# Patient Record
Sex: Female | Born: 1995
Health system: Southern US, Community
[De-identification: ages and names within clinical notes are randomized; demographics above are authoritative.]

## PROBLEM LIST (undated history)

## (undated) DIAGNOSIS — D219 Benign neoplasm of connective and other soft tissue, unspecified: Secondary | ICD-10-CM

## (undated) HISTORY — PX: WISDOM TOOTH EXTRACTION: SHX21

## (undated) HISTORY — DX: Benign neoplasm of connective and other soft tissue, unspecified: D21.9

---
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
## 2014-09-18 ENCOUNTER — Ambulatory Visit (INDEPENDENT_AMBULATORY_CARE_PROVIDER_SITE_OTHER): Payer: Medicaid Other | Admitting: Family Medicine

## 2014-09-18 ENCOUNTER — Encounter: Payer: Self-pay | Admitting: Family Medicine

## 2014-09-18 ENCOUNTER — Other Ambulatory Visit (HOSPITAL_COMMUNITY)
Admission: RE | Admit: 2014-09-18 | Discharge: 2014-09-18 | Disposition: A | Discharge status: Home / Self Care | Payer: Medicaid Other | Source: Ambulatory Visit | Attending: Family Medicine | Admitting: Family Medicine

## 2014-09-18 VITALS — BP 109/70 | HR 103 | Temp 98.5°F | Ht 59.5 in | Wt 103.3 lb

## 2014-09-18 DIAGNOSIS — Z113 Encounter for screening for infections with a predominantly sexual mode of transmission: Secondary | ICD-10-CM | POA: Diagnosis not present

## 2014-09-18 DIAGNOSIS — N760 Acute vaginitis: Secondary | ICD-10-CM

## 2014-09-18 DIAGNOSIS — R350 Frequency of micturition: Secondary | ICD-10-CM

## 2014-09-18 DIAGNOSIS — R300 Dysuria: Secondary | ICD-10-CM

## 2014-09-18 LAB — POCT WET PREP (WET MOUNT): CLUE CELLS WET PREP WHIFF POC: POSITIVE

## 2014-09-18 LAB — POCT URINALYSIS DIPSTICK
Bilirubin, UA: NEGATIVE
Blood, UA: NEGATIVE
Glucose, UA: NEGATIVE
Ketones, UA: NEGATIVE
Nitrite, UA: NEGATIVE
PH UA: 6.5
Protein, UA: NEGATIVE
Spec Grav, UA: 1.025
Urobilinogen, UA: 0.2

## 2014-09-18 LAB — POCT UA - MICROSCOPIC ONLY

## 2014-09-18 MED ORDER — METRONIDAZOLE 500 MG PO TABS: 500.0000 mg | ORAL_TABLET | Freq: Two times a day (BID) | ORAL | Status: DC

## 2014-09-18 NOTE — Addendum Note (Signed)
Addended by: Briscoe DeutscherHESS, Miaisabella Bacorn R on: 09/18/2014 04:14 PM   Modules accepted: Orders

## 2014-09-18 NOTE — Progress Notes (Addendum)
  Current Issues:  Presents with 3 days of dysuria, urinary urgency and urinary frequency Associated symptoms include:  None.  Denies fever, chills, sweats, CVA TTP  There is no history of of similar symptoms. Sexually active:  Yes with female.   No concern for STI but wants to be tested   Prior to Admission medications   Medication Sig Start Date End Date Taking? Authorizing Provider  cetirizine (ZYRTEC) 10 MG tablet Take 1 tablet (10 mg total) by mouth daily. for allergies 12/03/12   [facility]armys [place] Criselda PeachesLa Paz, MD  drospirenone-ethinyl estradiol (LORYNA) 3-0.02 MG per tablet Take 1 tablet by mouth daily.      Historical Provider, MD  mupirocin cream (BACTROBAN) 2 % Apply topically 3 (three) times daily. 06/02/12   [facility] [place] Cris[place]da PeachesLa Paz, MD    Review of Systems:Negative except mentioned in HPI  PE:  There were no vitals taken for this visit. Constitutional: NAD Heart: RRR Lungs: CTAB Back: no CVA TTP Abdomen: Soft/NT/ND, NABS  Results for orders placed or performed in visit on 09/08/12  POCT urine pregnancy  Result Value Ref Range   Preg Test, Ur Negative     Assessment and Plan:  1) Dysuria 2) Screening for STI 3) Vaginal D/C - Obtain POC U/A with microscopy.  Tx if indicated pending results  - Screen for STI including HIV/RPR/GC/Chlamydia  - Wet Prep and Tx accordingly   Addendum:  + Wet Prep for BV, Tx with Flagyl 500 mg BID x 7 days, discussed with pt, no questions

## 2014-09-18 NOTE — Patient Instructions (Signed)

## 2014-09-18 NOTE — Addendum Note (Signed)
Addended by: SwazilandJORDAN, Manolito Jurewicz on: 09/18/2014 04:09 PM   Modules accepted: Orders

## 2014-09-19 ENCOUNTER — Telehealth: Payer: Self-pay | Admitting: Family Medicine

## 2014-09-19 LAB — HIV ANTIBODY (ROUTINE TESTING W REFLEX): HIV 1&2 Ab, 4th Generation: NONREACTIVE

## 2014-09-19 LAB — RPR

## 2014-09-19 NOTE — Telephone Encounter (Signed)
Discussed results with pt regarding HIV/RPR, no questions further.  Twana FirstBryan R. Paulina FusiHess, DO of [facility] 09/19/2014, 12:24 PM

## 2014-09-20 LAB — URINE CYTOLOGY ANCILLARY ONLY
CHLAMYDIA, DNA PROBE: NEGATIVE
NEISSERIA GONORRHEA: NEGATIVE

## 2014-11-07 ENCOUNTER — Telehealth: Payer: Self-pay | Admitting: Family Medicine

## 2014-11-07 NOTE — Telephone Encounter (Signed)
Would like to have her prescription refill for yeast infection

## 2014-11-10 NOTE — Telephone Encounter (Signed)
Don't see anything on her sheet for yeast infection.  If she is still having d/c, she may need to be seen to confirm.    Thanks Tesoro CorporationBryan R. Paulina FusiHess, DO of [facility] 11/10/2014, 4:26 PM

## 2014-11-21 ENCOUNTER — Ambulatory Visit (INDEPENDENT_AMBULATORY_CARE_PROVIDER_SITE_OTHER): Payer: Medicaid Other | Admitting: Family Medicine

## 2014-11-21 ENCOUNTER — Telehealth: Payer: Self-pay | Admitting: Family Medicine

## 2014-11-21 ENCOUNTER — Encounter: Payer: Self-pay | Admitting: Family Medicine

## 2014-11-21 VITALS — BP 104/73 | HR 74 | Temp 98.4°F | Ht 59.5 in | Wt 100.6 lb

## 2014-11-21 DIAGNOSIS — N898 Other specified noninflammatory disorders of vagina: Secondary | ICD-10-CM

## 2014-11-21 LAB — POCT WET PREP (WET MOUNT)
Clue Cells Wet Prep Whiff POC: NEGATIVE
WBC, Wet Prep HPF POC: >20

## 2014-11-21 MED ORDER — FLUCONAZOLE 150 MG PO TABS: 150.0000 mg | ORAL_TABLET | Freq: Once | ORAL | Status: DC

## 2014-11-21 NOTE — Assessment & Plan Note (Addendum)
Wet prep collected today. AVS on safe sex and BV Patient will be called with results, and treated appropriately.

## 2014-11-21 NOTE — Progress Notes (Signed)
   Subjective:    Patient ID: Katherine Neal, female    DOB: [birth year], 19 y.o.   MRN: [id number]  HPI  Vaginal discharge: Patient presents to family medicine clinic with complaints of vaginal irritation. She states she is having a yellowish discharge. She had a bacterial infection in January, was given Flagyl with improvement in symptoms. She feels the symptoms are identical this time, minus the odor. She has the same sexual partner, female, condom use. Patient's menses are irregular, Mirena use.  Never smoker  No past medical history on file. No Known Allergies   Review of Systems Per hPI    Objective:   Physical Exam BP 104/73 mmHg  Pulse 74  Temp(Src) 98.4 F (36.9 C) (Oral)  Ht 4' 11.5" (1.511 m)  Wt 100 lb 9.6 oz (45.632 kg)  BMI 19.99 kg/m2 Gen: NAD. Nontoxic in appearance, pleasant, African-American female. Thin. Abd: Soft. Flat. NTND. BS present. No Masses palpated.  GYN:  External genitalia within normal limits.  Vaginal mucosa pink, moist, normal rugae.  Nonfriable cervix without lesions, moderate white/yellowish chunky discharge. No bleeding noted on speculum exam.  No cervical motion tenderness. No adnexal masses bilaterally.  IUD strings identified in cervical os        Assessment & Plan:

## 2014-11-21 NOTE — Progress Notes (Signed)
I was preceptor the day of this visit.   

## 2014-11-21 NOTE — Telephone Encounter (Signed)
Please call pt, she has a yeast infection. I have called in diflucan for her take. Thanks.

## 2014-11-21 NOTE — Patient Instructions (Signed)
I will call you with results once they become available and: Medication as needed.  Bacterial Vaginosis Bacterial vaginosis is a vaginal infection that occurs when the normal balance of bacteria in the vagina is disrupted. It results from an overgrowth of certain bacteria. This is the most common vaginal infection in women of childbearing age. Treatment is important to prevent complications, especially in pregnant women, as it can cause a premature delivery. CAUSES  Bacterial vaginosis is caused by an increase in harmful bacteria that are normally present in smaller amounts in the vagina. Several different kinds of bacteria can cause bacterial vaginosis. However, the reason that the condition develops is not fully understood. RISK FACTORS Certain activities or behaviors can put you at an increased risk of developing bacterial vaginosis, including:  Having a new sex partner or multiple sex partners.  Douching.  Using an intrauterine device (IUD) for contraception. Women do not get bacterial vaginosis from toilet seats, bedding, swimming pools, or contact with objects around them. SIGNS AND SYMPTOMS  Some women with bacterial vaginosis have no signs or symptoms. Common symptoms include:  Grey vaginal discharge.  A fishlike odor with discharge, especially after sexual intercourse.  Itching or burning of the vagina and vulva.  Burning or pain with urination. DIAGNOSIS  Your health care provider will take a medical history and examine the vagina for signs of bacterial vaginosis. A sample of vaginal fluid may be taken. Your health care provider will look at this sample under a microscope to check for bacteria and abnormal cells. A vaginal pH test may also be done.  TREATMENT  Bacterial vaginosis may be treated with antibiotic medicines. These may be given in the form of a pill or a vaginal cream. A second round of antibiotics may be prescribed if the condition comes back after treatment.  HOME  CARE INSTRUCTIONS   Only take over-the-counter or prescription medicines as directed by your health care provider.  If antibiotic medicine was prescribed, take it as directed. Make sure you finish it even if you start to feel better.  Do not have sex until treatment is completed.  Tell all sexual partners that you have a vaginal infection. They should see their health care provider and be treated if they have problems, such as a mild rash or itching.  Practice safe sex by using condoms and only having one sex partner. SEEK MEDICAL CARE IF:   Your symptoms are not improving after 3 days of treatment.  You have increased discharge or pain.  You have a fever. MAKE SURE YOU:   Understand these instructions.  Will watch your condition.  Will get help right away if you are not doing well or get worse. FOR MORE INFORMATION  Centers for Disease Control and Prevention, Division of STD Prevention: SolutionApps.co.zawww.cdc.gov/std American Sexual Health Association (ASHA): www.ashastd.org  Document Released: 08/18/2005 Document Revised: 06/08/2013 Document Reviewed: 03/30/2013 [facility] Patient Information 2015 [place]. This information is not intended to replace advice given to you by your health care provider. Make sure you discuss any questions you have with your health care provider. Safe Sex Safe sex is about reducing the risk of giving or getting a sexually transmitted disease (STD). STDs are spread through sexual contact involving the genitals, mouth, or rectum. Some STDs can be cured and others cannot. Safe sex can also prevent unintended pregnancies.  WHAT ARE SOME SAFE SEX PRACTICES?  Limit your sexual activity to only one partner who is having sex with only you.  Talk  to your partner about his or her past partners, past STDs, and drug use.  Use a condom every time you have sexual intercourse. This includes vaginal, oral, and anal sexual activity. Both females and males should wear condoms  during oral sex. Only use latex or polyurethane condoms and water-based lubricants. Using petroleum-based lubricants or oils to lubricate a condom will weaken the condom and increase the chance that it will break. The condom should be in place from the beginning to the end of sexual activity. Wearing a condom reduces, but does not completely eliminate, your risk of getting or giving an STD. STDs can be spread by contact with infected body fluids and skin.  Get vaccinated for hepatitis B and HPV.  Avoid alcohol and recreational drugs, which can affect your judgment. You may forget to use a condom or participate in high-risk sex.  For females, avoid douching after sexual intercourse. Douching can spread an infection farther into the reproductive tract.  Check your body for signs of sores, blisters, rashes, or unusual discharge. See your health care provider if you notice any of these signs.  Avoid sexual contact if you have symptoms of an infection or are being treated for an STD. If you or your partner has herpes, avoid sexual contact when blisters are present. Use condoms at all other times.  If you are at risk of being infected with HIV, it is recommended that you take a prescription medicine daily to prevent HIV infection. This is called pre-exposure prophylaxis (PrEP). You are considered at risk if:  You are a man who has sex with other men (MSM).  You are a heterosexual man or woman who is sexually active with more than one partner.  You take drugs by injection.  You are sexually active with a partner who has HIV.  Talk with your health care provider about whether you are at high risk of being infected with HIV. If you choose to begin PrEP, you should first be tested for HIV. You should then be tested every 3 months for as long as you are taking PrEP.  See your health care provider for regular screenings, exams, and tests for other STDs. Before having sex with a new partner, each of you  should be screened for STDs and should talk about the results with each other. WHAT ARE THE BENEFITS OF SAFE SEX?   There is less chance of getting or giving an STD.  You can prevent unwanted or unintended pregnancies.  By discussing safe sex concerns with your partner, you may increase feelings of intimacy, comfort, trust, and honesty between the two of you. Document Released: 09/25/2004 Document Revised: 01/02/2014 Document Reviewed: 02/09/2012 [facility] Patient Information 2015 [place], [place]. This information is not intended to replace advice given to you by your health care provider. Make sure you discuss any questions you have with your health care provider.

## 2014-11-22 ENCOUNTER — Telehealth: Payer: Self-pay | Admitting: Family Medicine

## 2014-11-22 NOTE — Telephone Encounter (Signed)
LVM on mobile and also tried home 1st (said not available try again later) to inform her of message below. [name], Eadie Repetto D

## 2014-11-22 NOTE — Telephone Encounter (Signed)
Katherine FootmanSara Neal spoke to pt regarding yeast infection / Katherine BasemanSadie Neal, ASA

## 2014-11-22 NOTE — Telephone Encounter (Signed)
Katherine FootmanSara Neal spoke to [name], ASA

## 2014-12-19 ENCOUNTER — Ambulatory Visit (INDEPENDENT_AMBULATORY_CARE_PROVIDER_SITE_OTHER): Payer: Medicaid Other | Admitting: Family Medicine

## 2014-12-19 ENCOUNTER — Other Ambulatory Visit (HOSPITAL_COMMUNITY)
Admission: RE | Admit: 2014-12-19 | Discharge: 2014-12-19 | Disposition: A | Discharge status: Home / Self Care | Payer: Medicaid Other | Source: Ambulatory Visit | Attending: Family Medicine | Admitting: Family Medicine

## 2014-12-19 ENCOUNTER — Encounter: Payer: Self-pay | Admitting: Family Medicine

## 2014-12-19 VITALS — BP 104/72 | HR 94 | Temp 98.5°F | Ht 59.5 in | Wt 102.1 lb

## 2014-12-19 DIAGNOSIS — N898 Other specified noninflammatory disorders of vagina: Secondary | ICD-10-CM | POA: Diagnosis not present

## 2014-12-19 DIAGNOSIS — N760 Acute vaginitis: Secondary | ICD-10-CM | POA: Insufficient documentation

## 2014-12-19 DIAGNOSIS — R300 Dysuria: Secondary | ICD-10-CM | POA: Diagnosis not present

## 2014-12-19 DIAGNOSIS — Z113 Encounter for screening for infections with a predominantly sexual mode of transmission: Secondary | ICD-10-CM | POA: Insufficient documentation

## 2014-12-19 DIAGNOSIS — N9489 Other specified conditions associated with female genital organs and menstrual cycle: Secondary | ICD-10-CM

## 2014-12-19 DIAGNOSIS — N949 Unspecified condition associated with female genital organs and menstrual cycle: Secondary | ICD-10-CM | POA: Diagnosis not present

## 2014-12-19 LAB — POCT WET PREP (WET MOUNT): CLUE CELLS WET PREP WHIFF POC: POSITIVE

## 2014-12-19 LAB — POCT URINALYSIS DIPSTICK
Bilirubin, UA: NEGATIVE
Blood, UA: NEGATIVE
Glucose, UA: NEGATIVE
Ketones, UA: NEGATIVE
Leukocytes, UA: NEGATIVE
Nitrite, UA: NEGATIVE
Protein, UA: NEGATIVE
Spec Grav, UA: 1.02
Urobilinogen, UA: 0.2
pH, UA: 6

## 2014-12-19 LAB — POCT URINE PREGNANCY: Preg Test, Ur: NEGATIVE

## 2014-12-19 MED ORDER — METRONIDAZOLE 500 MG PO TABS: 500.0000 mg | ORAL_TABLET | Freq: Two times a day (BID) | ORAL | Status: DC

## 2014-12-19 NOTE — Patient Instructions (Addendum)
We are checking several tests: -ultrasound -STD screening  It also looks like you have bacterial vaginosis. Take flagyl 500mg  twice a day for 7 days. Do not drink alcohol while on this medicine.  If your pain gets worse, you have a fever, you can't eat or drink, or have any other new symptoms, please return for a visit or go to the ER.   I will call you or send you a letter with your test results.  Be well, Dr. Pollie MeyerMcIntyre

## 2014-12-20 LAB — CERVICOVAGINAL ANCILLARY ONLY
CHLAMYDIA, DNA PROBE: NEGATIVE
NEISSERIA GONORRHEA: NEGATIVE
TRICH (WINDOWPATH): NEGATIVE

## 2014-12-20 NOTE — Progress Notes (Signed)
Patient ID: Katherine Neal, female   DOB: March 07, 1996, 19 y.o.   MRN: [id number]  HPI:  Pt presents for a same day appointment to discuss vaginal discharge.  Patient reports that Wednesday or Thursday of last week began having pain and swelling on the inside of her vulva. She has noted some brownish mild discharge. Has not had any fever. The pain has decreased to about a 4 out of 10. Eating and drinking well. Normally stooling. Has mild burning with urine. Has had a Mirena since February 2014. Does not get periods with her IUD. She is sexually active with one female partner in the last year. Denies any concerns for STDs.   ROS: See HPI  PMFSH: hx allergic rhinitis, IUD in place  PHYSICAL EXAM: BP 104/72 mmHg  Pulse 94  Temp(Src) 98.5 F (36.9 C) (Oral)  Ht 4' 11.5" (1.511 m)  Wt 102 lb 1.6 oz (46.312 kg)  BMI 20.28 kg/m2 Gen: No acute distress, pleasant, cooperative Abdomen: Soft, nontender to palpation GU: normal appearing external genitalia without lesions. Vagina is moist with clear discharge. Cervix normal in appearance. There is a prominent left adnexal mass on bimanual exam, which is also tender to palpation. No pain with cervical motion. No right adnexal mass.  Neuro: grossly nonfocal speech normal  ASSESSMENT/PLAN:  Adnexal mass Prominent left adnexal mass on pelvic exam. Differential diagnosis includes ovarian cyst, tubo-ovarian abscess, ectopic pregnancy, malignancy. Urine pregnancy test negative today. -Check GC/Chlamydia and wet prep -Pelvic ultrasound to further evaluate mass -Discussed reasons which should prompt repeat evaluation including increased pain, vomiting, fever   Vaginal discharge Wet prep suggestive of BV. Urinalysis negative for any signs of infection. Will treat with Flagyl for 7 days. Follow-up if not improving.     FOLLOW UP: F/u pending results of ultrasound & STD testing  GrenadaBrittany J. Pollie MeyerMcIntyre, MD [facility]

## 2014-12-22 ENCOUNTER — Encounter: Payer: Self-pay | Admitting: Family Medicine

## 2014-12-22 DIAGNOSIS — N9489 Other specified conditions associated with female genital organs and menstrual cycle: Secondary | ICD-10-CM | POA: Insufficient documentation

## 2014-12-22 NOTE — Assessment & Plan Note (Signed)
Wet prep suggestive of BV. Urinalysis negative for any signs of infection. Will treat with Flagyl for 7 days. Follow-up if not improving.

## 2014-12-22 NOTE — Assessment & Plan Note (Addendum)
Prominent left adnexal mass on pelvic exam. Differential diagnosis includes ovarian cyst, tubo-ovarian abscess, ectopic pregnancy, malignancy. Urine pregnancy test negative today. -Check GC/Chlamydia and wet prep -Pelvic ultrasound to further evaluate mass -Discussed reasons which should prompt repeat evaluation including increased pain, vomiting, fever

## 2014-12-25 ENCOUNTER — Ambulatory Visit (HOSPITAL_COMMUNITY): Payer: Medicaid Other

## 2014-12-25 NOTE — Progress Notes (Signed)
I was the preceptor for this visit. 

## 2014-12-27 ENCOUNTER — Ambulatory Visit (HOSPITAL_COMMUNITY)
Admission: RE | Admit: 2014-12-27 | Discharge: 2014-12-27 | Disposition: A | Discharge status: Home / Self Care | Payer: Medicaid Other | Source: Ambulatory Visit | Attending: Family Medicine | Admitting: Family Medicine

## 2014-12-27 ENCOUNTER — Encounter (HOSPITAL_COMMUNITY): Payer: Self-pay

## 2014-12-27 DIAGNOSIS — N9489 Other specified conditions associated with female genital organs and menstrual cycle: Secondary | ICD-10-CM | POA: Diagnosis present

## 2014-12-27 DIAGNOSIS — Z30431 Encounter for routine checking of intrauterine contraceptive device: Secondary | ICD-10-CM | POA: Diagnosis not present

## 2014-12-29 ENCOUNTER — Telehealth: Payer: Self-pay | Admitting: Family Medicine

## 2014-12-29 NOTE — Telephone Encounter (Signed)
Called patient regarding ultrasound results. Informed of normal ultrasound. Recommend she return for a visit for repeat pelvic exam. If left adnexal mass is still palpable, would proceed with CT abdomen and pelvis with contrast to ensure we are not missing any bowel pathology or other pelvic pathology. If she does need a CT scan, she will need lab work drawn to assess renal function prior to administering contrast.  Answered questions to patient's satisfaction. Will route to PCP as an FYI.  Latrelle DodrillBrittany J Kiira Brach, MD

## 2015-03-13 ENCOUNTER — Emergency Department (HOSPITAL_COMMUNITY)
Admission: EM | Admit: 2015-03-13 | Discharge: 2015-03-14 | Disposition: A | Discharge status: Home / Self Care | Payer: Medicaid Other | Attending: Emergency Medicine | Admitting: Emergency Medicine

## 2015-03-13 ENCOUNTER — Encounter (HOSPITAL_COMMUNITY): Payer: Self-pay | Admitting: Emergency Medicine

## 2015-03-13 DIAGNOSIS — Z79899 Other long term (current) drug therapy: Secondary | ICD-10-CM | POA: Diagnosis not present

## 2015-03-13 DIAGNOSIS — Z79818 Long term (current) use of other agents affecting estrogen receptors and estrogen levels: Secondary | ICD-10-CM | POA: Insufficient documentation

## 2015-03-13 DIAGNOSIS — N390 Urinary tract infection, site not specified: Secondary | ICD-10-CM

## 2015-03-13 DIAGNOSIS — Z3202 Encounter for pregnancy test, result negative: Secondary | ICD-10-CM | POA: Diagnosis not present

## 2015-03-13 DIAGNOSIS — Z792 Long term (current) use of antibiotics: Secondary | ICD-10-CM | POA: Insufficient documentation

## 2015-03-13 DIAGNOSIS — R102 Pelvic and perineal pain: Secondary | ICD-10-CM | POA: Diagnosis present

## 2015-03-13 NOTE — ED Notes (Signed)
Pt states that tonight she noticed blood in her urine and she has lower pelvic pain. Alert and oriented.

## 2015-03-14 LAB — URINALYSIS, ROUTINE W REFLEX MICROSCOPIC
Bilirubin Urine: NEGATIVE
GLUCOSE, UA: NEGATIVE mg/dL
Ketones, ur: NEGATIVE mg/dL
Nitrite: NEGATIVE
Protein, ur: 100 mg/dL — AB
Specific Gravity, Urine: 1.013 (ref 1.005–1.030)
Urobilinogen, UA: 0.2 mg/dL (ref 0.0–1.0)
pH: 6 (ref 5.0–8.0)

## 2015-03-14 LAB — PREGNANCY, URINE: Preg Test, Ur: NEGATIVE

## 2015-03-14 LAB — URINE MICROSCOPIC-ADD ON

## 2015-03-14 MED ORDER — CEPHALEXIN 500 MG PO CAPS: 500.0000 mg | ORAL_CAPSULE | Freq: Four times a day (QID) | ORAL | Status: DC

## 2015-03-14 MED ORDER — PHENAZOPYRIDINE HCL 200 MG PO TABS: 200.0000 mg | ORAL_TABLET | Freq: Three times a day (TID) | ORAL | Status: DC

## 2015-03-14 NOTE — ED Provider Notes (Signed)
CSN: [id number]     Arrival date & time 03/13/15  2106 History   First MD Initiated Contact with Patient 03/14/15 0048     Chief Complaint  Patient presents with  . Pelvic Pain  . Hematuria     (Consider location/radiation/quality/duration/timing/severity/associated sxs/prior Treatment) Patient is a 19 y.o. female presenting with pelvic pain and hematuria. The history is provided by the patient. No language interpreter was used.  Pelvic Pain This is a new problem. The current episode started today. Pertinent negatives include no chills, fever, nausea or vomiting. Associated symptoms comments: She started experiencing sharp suprapubic pain associated with urinary frequency, dysuria and a sense she still had to urinate when finished. No fever, nausea, vomiting, vaginal discharge or abnormal vaginal bleeding..  Hematuria Pertinent negatives include no chills, fever, nausea or vomiting.    History reviewed. No pertinent past medical history. Past Surgical History  Procedure Laterality Date  . Wisdom tooth extraction     History reviewed. No pertinent family history. History  Substance Use Topics  . Smoking status: Never Smoker   . Smokeless tobacco: Not on file  . Alcohol Use: No   OB History    Gravida Para Term Preterm AB TAB SAB Ectopic Multiple Living   0 0 0 0 0 0 0 0 0 0      Review of Systems  Constitutional: Negative for fever and chills.  Gastrointestinal: Negative.  Negative for nausea and vomiting.  Genitourinary: Positive for dysuria, frequency, hematuria and pelvic pain. Negative for vaginal bleeding and vaginal discharge.  Musculoskeletal: Negative.   Skin: Negative.   Neurological: Negative.       Allergies  Review of patient's allergies indicates no known allergies.  Home Medications   Prior to Admission medications   Medication Sig Start Date End Date Taking? Authorizing Provider  cetirizine (ZYRTEC) 10 MG tablet Take 1 tablet (10 mg total) by mouth  daily. for allergies 12/03/12   Dayarmys Piloto de Criselda PeachesLa Paz, MD  drospirenone-ethinyl estradiol (LORYNA) 3-0.02 MG per tablet Take 1 tablet by mouth daily.      Historical Provider, MD  fluconazole (DIFLUCAN) 150 MG tablet Take 1 tablet (150 mg total) by mouth once. 11/21/14   Renee A Kuneff, DO  metroNIDAZOLE (FLAGYL) 500 MG tablet Take 1 tablet (500 mg total) by mouth 2 (two) times daily. 12/19/14   Latrelle DodrillBrittany J McIntyre, MD  mupirocin cream (BACTROBAN) 2 % Apply topically 3 (three) times daily. 06/02/12   Dayarmys [place] Cris[place]da PeachesLa Paz, MD   BP 113/69 mmHg  Pulse 92  Temp(Src) 99 F (37.2 C) (Oral)  Resp 15  Ht 4\' 11"  (1.499 m)  Wt 104 lb (47.174 kg)  BMI 20.99 kg/m2  SpO2 100% Physical Exam  Constitutional: She is oriented to person, place, and time. She appears well-developed and well-nourished.  Neck: Normal range of motion.  Cardiovascular: Normal rate.   Pulmonary/Chest: Effort normal.  Abdominal: There is no tenderness. There is no rebound and no guarding.  Neurological: She is alert and oriented to person, place, and time.  Skin: Skin is warm and dry.    ED Course  Procedures (including critical care time) Labs Review Labs Reviewed  URINALYSIS, ROUTINE W REFLEX MICROSCOPIC (NOT AT [facility])  PREGNANCY, URINE    Imaging Review No results found.   EKG Interpretation None      MDM   Final diagnoses:  None    1. UTI  Uncomplicated hemorrhagic cystitis requiring antibiotic treatment. Stable for discharge.  Elpidio Anis, PA-C 03/14/15 0622  Layla Maw Ward, DO 03/14/15 9563

## 2015-03-14 NOTE — Discharge Instructions (Signed)

## 2015-08-02 ENCOUNTER — Ambulatory Visit (INDEPENDENT_AMBULATORY_CARE_PROVIDER_SITE_OTHER): Payer: Medicaid Other | Admitting: Family Medicine

## 2015-08-02 VITALS — BP 108/59 | HR 105 | Temp 98.4°F | Ht 59.0 in | Wt 99.6 lb

## 2015-08-02 DIAGNOSIS — Z30433 Encounter for removal and reinsertion of intrauterine contraceptive device: Secondary | ICD-10-CM | POA: Diagnosis present

## 2015-08-02 MED ORDER — LEVONORGESTREL 20 MCG/24HR IU IUD
INTRAUTERINE_SYSTEM | Freq: Once | INTRAUTERINE | Status: AC
  Administered 2015-08-02: 1 via INTRAUTERINE

## 2015-08-02 NOTE — Progress Notes (Signed)
Patient ID: Katherine Neal, female   DOB: [birth year], 19 y.o.   MRN: [id number] PRE-OP DIAGNOSIS: desired long-term, reversible contraception ( Skyla removal and Mirena insertion).  POST-OP DIAGNOSIS: Same   PROCEDURE: IUD removal and placement  Performing Physician: Dr. Lum BabeEniola and Dr. Jaquita RectorMelancon  Supervising Physician (if applicable): Dr. Lum BabeEniola    IUD type: [X_] Mirena   [_] Paraguard   Skyla removal was successfully carried out without complication by Dr. Jaquita RectorMelancon   PROCEDURE:  Timeout procedure was performed to ensure right patient and right site.  The speculum was placed. The vagina and cervix was sterilized in the usual manner and sterile technique was maintained throughout the course of the procedure. A single toothed tenaculum was applied to the anterior lip of the cervix and gentle traction applied to straighten and stabilize it. The depth of the uterus was sounded to be of appropriate depth  6.5 . With gentle traction on the tenaculum, the IUD was inserted to the appropriate depth and deposited by withdrawing on the insertion tube holding the rod steady.  The string was cut to an estimated 2-3 cm length. Bleeding was minimal. The patient tolerated the procedure well.    Followup: The patient tolerated the procedure well without complications.  Standard post-procedure care is explained and return precautions are given.

## 2015-08-02 NOTE — Patient Instructions (Signed)
Please come back in 1-2 wks for thread check.  Levonorgestrel intrauterine device (IUD) What is this medicine? LEVONORGESTREL IUD (LEE voe nor jes trel) is a contraceptive (birth control) device. The device is placed inside the uterus by a healthcare professional. It is used to prevent pregnancy and can also be used to treat heavy bleeding that occurs during your period. Depending on the device, it can be used for 3 to 5 years. This medicine may be used for other purposes; ask your health care provider or pharmacist if you have questions. What should I tell my health care provider before I take this medicine? They need to know if you have any of these conditions: -abnormal Pap smear -cancer of the breast, uterus, or cervix -diabetes -endometritis -genital or pelvic infection now or in the past -have more than one sexual partner or your partner has more than one partner -heart disease -history of an ectopic or tubal pregnancy -immune system problems -IUD in place -liver disease or tumor -problems with blood clots or take blood-thinners -use intravenous drugs -uterus of unusual shape -vaginal bleeding that has not been explained -an unusual or allergic reaction to levonorgestrel, other hormones, silicone, or polyethylene, medicines, foods, dyes, or preservatives -pregnant or trying to get pregnant -breast-feeding How should I use this medicine? This device is placed inside the uterus by a health care professional. Talk to your pediatrician regarding the use of this medicine in children. Special care may be needed. Overdosage: If you think you have taken too much of this medicine contact a poison control center or emergency room at once. NOTE: This medicine is only for you. Do not share this medicine with others. What if I miss a dose? This does not apply. What may interact with this medicine? Do not take this medicine with any of the following  medications: -amprenavir -bosentan -fosamprenavir This medicine may also interact with the following medications: -aprepitant -barbiturate medicines for inducing sleep or treating seizures -bexarotene -griseofulvin -medicines to treat seizures like carbamazepine, ethotoin, felbamate, oxcarbazepine, phenytoin, topiramate -modafinil -pioglitazone -rifabutin -rifampin -rifapentine -some medicines to treat HIV infection like atazanavir, indinavir, lopinavir, nelfinavir, tipranavir, ritonavir -St. John's wort -warfarin This list may not describe all possible interactions. Give your health care provider a list of all the medicines, herbs, non-prescription drugs, or dietary supplements you use. Also tell them if you smoke, drink alcohol, or use illegal drugs. Some items may interact with your medicine. What should I watch for while using this medicine? Visit your doctor or health care professional for regular check ups. See your doctor if you or your partner has sexual contact with others, becomes HIV positive, or gets a sexual transmitted disease. This product does not protect you against HIV infection (AIDS) or other sexually transmitted diseases. You can check the placement of the IUD yourself by reaching up to the top of your vagina with clean fingers to feel the threads. Do not pull on the threads. It is a good habit to check placement after each menstrual period. Call your doctor right away if you feel more of the IUD than just the threads or if you cannot feel the threads at all. The IUD may come out by itself. You may become pregnant if the device comes out. If you notice that the IUD has come out use a backup birth control method like condoms and call your health care provider. Using tampons will not change the position of the IUD and are okay to use during your period.  What side effects may I notice from receiving this medicine? Side effects that you should report to your doctor or  health care professional as soon as possible: -allergic reactions like skin rash, itching or hives, swelling of the face, lips, or tongue -fever, flu-like symptoms -genital sores -high blood pressure -no menstrual period for 6 weeks during use -pain, swelling, warmth in the leg -pelvic pain or tenderness -severe or sudden headache -signs of pregnancy -stomach cramping -sudden shortness of breath -trouble with balance, talking, or walking -unusual vaginal bleeding, discharge -yellowing of the eyes or skin Side effects that usually do not require medical attention (report to your doctor or health care professional if they continue or are bothersome): -acne -breast pain -change in sex drive or performance -changes in weight -cramping, dizziness, or faintness while the device is being inserted -headache -irregular menstrual bleeding within first 3 to 6 months of use -nausea This list may not describe all possible side effects. Call your doctor for medical advice about side effects. You may report side effects to FDA at 1-800-FDA-1088. Where should I keep my medicine? This does not apply. NOTE: This sheet is a summary. It may not cover all possible information. If you have questions about this medicine, talk to your doctor, pharmacist, or health care provider.    2016, Elsevier/Gold Standard. (2011-09-18 13:54:04)

## 2015-08-02 NOTE — Addendum Note (Signed)
Addended by: Jone BasemanFLEEGER, JESSICA D on: 08/02/2015 02:35 PM   Modules accepted: Orders

## 2015-08-28 ENCOUNTER — Ambulatory Visit (INDEPENDENT_AMBULATORY_CARE_PROVIDER_SITE_OTHER): Payer: Medicaid Other | Admitting: Family Medicine

## 2015-08-28 ENCOUNTER — Encounter: Payer: Self-pay | Admitting: Family Medicine

## 2015-08-28 VITALS — BP 89/58 | HR 92 | Temp 98.4°F | Wt 101.0 lb

## 2015-08-28 DIAGNOSIS — Z23 Encounter for immunization: Secondary | ICD-10-CM

## 2015-08-28 DIAGNOSIS — N898 Other specified noninflammatory disorders of vagina: Secondary | ICD-10-CM

## 2015-08-28 LAB — POCT WET PREP (WET MOUNT): Clue Cells Wet Prep Whiff POC: POSITIVE

## 2015-08-28 MED ORDER — METRONIDAZOLE 0.75 % VA GEL: 1.0000 | Freq: Every day | VAGINAL | Status: AC

## 2015-08-28 NOTE — Patient Instructions (Signed)
Thank you so much for coming in today! Your IUD strings were visualized on exam. I will contact you with the results of your wet prep and will prescribe a medication if necessary.   Thanks again! Dr. Caroleen Hammanumley

## 2015-08-28 NOTE — Assessment & Plan Note (Signed)
-   Wet prep today with moderate bacteria, moderate clue cells, and positive whiff test - Requests alternative to oral metronidazole. Vaginal metronidazole cream given to complete five day course. - Refuses STD Screening given recent screen in 12/2014 with no new sexual partners. Consider if fails to improve.

## 2015-08-28 NOTE — Progress Notes (Signed)
Subjective:     Patient ID: Katherine Neal, female   DOB: 1996-06-12, 19 y.o.   MRN: [id number]  HPI [name] is a 19yo female presenting today for IUD string check and vaginal discharge. - IUD placed 08/02/15. Denies complications. - Reports vaginal discharge for four days. Discharge yellow/white. Denies new sexual partners. Negative screening for Gonorrhea and Chlamydia in 12/2014. Denies vaginal itching or sores. Denies fevers or abdominal pain. Denies dysuria or increased frequency. States it feels similar to prior episodes of bacterial vaginosis. Requests if bacterial vaginosis is present to not have oral Metronidazole, stating this made her gag last time.  Review of Systems Per HPI    Objective:   Physical Exam  Constitutional: She appears well-developed and well-nourished. No distress.  Abdominal: Soft. She exhibits no distension. There is no tenderness.  Genitourinary:  Tips of IUD strings visualized at cervical os. Minimal discharge noted.  Skin: No rash noted.  Psychiatric: She has a normal mood and affect. Her behavior is normal.      Assessment and Plan:     Vaginal discharge - Wet prep today with moderate bacteria, moderate clue cells, and positive whiff test - Requests alternative to oral metronidazole. Vaginal metronidazole cream given to complete five day course. - Refuses STD Screening given recent screen in 12/2014 with no new sexual partners. Consider if fails to improve.

## 2015-12-20 ENCOUNTER — Encounter: Payer: Self-pay | Admitting: Family Medicine

## 2015-12-20 ENCOUNTER — Ambulatory Visit (INDEPENDENT_AMBULATORY_CARE_PROVIDER_SITE_OTHER): Payer: Medicaid Other | Admitting: Family Medicine

## 2015-12-20 VITALS — BP 110/78 | HR 87 | Temp 100.3°F | Ht 59.0 in | Wt 102.3 lb

## 2015-12-20 DIAGNOSIS — L910 Hypertrophic scar: Secondary | ICD-10-CM

## 2015-12-20 DIAGNOSIS — Z30431 Encounter for routine checking of intrauterine contraceptive device: Secondary | ICD-10-CM

## 2015-12-20 LAB — POCT URINE PREGNANCY: Preg Test, Ur: NEGATIVE

## 2015-12-20 NOTE — Assessment & Plan Note (Signed)
Attempted to reduce with silver nitrate but had no impact.  - referral to dermatology  - discussed with Dr. Leveda AnnaHensel.

## 2015-12-20 NOTE — Progress Notes (Signed)
   Subjective:    Katherine Neal - 20 y.o. female MRN [id number]  Date of birth: 1996-05-31  CC keloid   HPI  Katherine Neal is here for keloid.  Keloid:  Yesterday she saw some clear fluid draining from it and then the skin came off. There is some pain associated with it since this happened. She get her ears. When she was 12 as had the keloid since then. It is on the posterior aspect of her left tragus. She denies any fevers or chills. There is nothing else draining from it since that time. She would like to have removed if at all possible.  IUD check up:  She checks for her IUD strings 1-2 times monthly. The last time she checked she was unable to feel anything. She has been sexually active. Denies any new partners.  Health Maintenance:  Health Maintenance Due  Topic Date Due  . TETANUS/TDAP  01/29/2014  . CHLAMYDIA SCREENING  12/19/2015    Review of Systems See HPI     Objective:   Physical Exam BP 110/78 mmHg  Pulse 87  Temp(Src) 100.3 F (37.9 C) (Oral)  Ht 4\' 11"  (1.499 m)  Wt 102 lb 4.8 oz (46.403 kg)  BMI 20.65 kg/m2 Gen: NAD, alert, cooperative with exam, well-appearing HEENT: granular keloid on posterior tragus of left ear. No active draining and no erythema  GU: External: no lesions Vagina: no blood in vault Cervix: no lesion; no mucopurulent d/c; NO strings observed             Assessment & Plan:   IUD check up Attempted to see string by performing different maneuvers but to no avail  - urine pregnancy today  - schedule a transvaginal and complete US for placement   Keloid Attempted to reduce with silver nitrate but had no impact.  - referral to dermatology  - discussed with Dr. Leveda AnnaHensel.

## 2015-12-20 NOTE — Assessment & Plan Note (Signed)
Attempted to see string by performing different maneuvers but to no avail  - urine pregnancy today  - schedule a transvaginal and complete US for placement

## 2015-12-20 NOTE — Patient Instructions (Signed)
Thank you for coming in,   We will refer you to dermatology   We will need to get an transvaginal ultrasound to look for the IUD.   Please bring all of your medications with you to each visit.   Sign up for My Chart to have easy access to your labs results, and communication with your Primary care physician   Please feel free to call with any questions or concerns at any time, at [phone]. --Dr. Jordan LikesSchmitz

## 2015-12-27 ENCOUNTER — Ambulatory Visit (HOSPITAL_COMMUNITY): Payer: Medicaid Other

## 2016-01-04 ENCOUNTER — Ambulatory Visit (HOSPITAL_COMMUNITY): Payer: Medicaid Other

## 2016-01-08 ENCOUNTER — Ambulatory Visit (HOSPITAL_COMMUNITY)
Admission: RE | Admit: 2016-01-08 | Discharge: 2016-01-08 | Disposition: A | Discharge status: Home / Self Care | Payer: Medicaid Other | Source: Ambulatory Visit | Attending: Family Medicine | Admitting: Family Medicine

## 2016-01-08 DIAGNOSIS — Z30431 Encounter for routine checking of intrauterine contraceptive device: Secondary | ICD-10-CM | POA: Insufficient documentation

## 2016-01-08 DIAGNOSIS — N854 Malposition of uterus: Secondary | ICD-10-CM | POA: Insufficient documentation

## 2016-01-14 ENCOUNTER — Telehealth: Payer: Self-pay | Admitting: Family Medicine

## 2016-01-14 NOTE — Telephone Encounter (Signed)
Left VM for patient. If she calls back please have her speak with a nurse/CMA and inform her that the ultrasound showed the IUD in place.   If any questions then please take the best time and phone number to call and I will try to call her back.   Myra RudeJeremy E Nayana Lenig, MD PGY-3, [facility] 01/14/2016, 12:19 PM

## 2016-08-21 ENCOUNTER — Encounter (HOSPITAL_COMMUNITY): Payer: Self-pay | Admitting: Emergency Medicine

## 2016-08-21 ENCOUNTER — Emergency Department (HOSPITAL_COMMUNITY)
Admission: EM | Admit: 2016-08-21 | Discharge: 2016-08-21 | Disposition: A | Discharge status: Home / Self Care | Payer: Self-pay | Attending: Emergency Medicine | Admitting: Emergency Medicine

## 2016-08-21 ENCOUNTER — Emergency Department (HOSPITAL_COMMUNITY): Payer: Self-pay

## 2016-08-21 DIAGNOSIS — Y929 Unspecified place or not applicable: Secondary | ICD-10-CM | POA: Insufficient documentation

## 2016-08-21 DIAGNOSIS — Y939 Activity, unspecified: Secondary | ICD-10-CM | POA: Insufficient documentation

## 2016-08-21 DIAGNOSIS — R55 Syncope and collapse: Secondary | ICD-10-CM | POA: Insufficient documentation

## 2016-08-21 DIAGNOSIS — S00511A Abrasion of lip, initial encounter: Secondary | ICD-10-CM | POA: Insufficient documentation

## 2016-08-21 DIAGNOSIS — W1830XA Fall on same level, unspecified, initial encounter: Secondary | ICD-10-CM | POA: Insufficient documentation

## 2016-08-21 DIAGNOSIS — Y990 Civilian activity done for income or pay: Secondary | ICD-10-CM | POA: Insufficient documentation

## 2016-08-21 LAB — CBC WITH DIFFERENTIAL/PLATELET
BASOS ABS: 0 10*3/uL (ref 0.0–0.1)
Basophils Relative: 0 %
EOS PCT: 0 %
Eosinophils Absolute: 0 10*3/uL (ref 0.0–0.7)
HCT: 39 % (ref 36.0–46.0)
Hemoglobin: 13.3 g/dL (ref 12.0–15.0)
Lymphocytes Relative: 25 %
Lymphs Abs: 1.5 10*3/uL (ref 0.7–4.0)
MCH: 32.6 pg (ref 26.0–34.0)
MCHC: 34.1 g/dL (ref 30.0–36.0)
MCV: 95.6 fL (ref 78.0–100.0)
Monocytes Absolute: 0.2 10*3/uL (ref 0.1–1.0)
Monocytes Relative: 4 %
Neutro Abs: 4.5 10*3/uL (ref 1.7–7.7)
Neutrophils Relative %: 71 %
PLATELETS: 248 10*3/uL (ref 150–400)
RBC: 4.08 MIL/uL (ref 3.87–5.11)
RDW: 12.4 % (ref 11.5–15.5)
WBC: 6.3 10*3/uL (ref 4.0–10.5)

## 2016-08-21 LAB — BASIC METABOLIC PANEL
Anion gap: 11 (ref 5–15)
BUN: 10 mg/dL (ref 6–20)
CO2: 24 mmol/L (ref 22–32)
Calcium: 8.9 mg/dL (ref 8.9–10.3)
Chloride: 104 mmol/L (ref 101–111)
Creatinine, Ser: 0.82 mg/dL (ref 0.44–1.00)
GFR calc Af Amer: >60 mL/min (ref >60)
Glucose, Bld: 72 mg/dL (ref 65–99)
POTASSIUM: 3.6 mmol/L (ref 3.5–5.1)
SODIUM: 139 mmol/L (ref 135–145)

## 2016-08-21 LAB — CBG MONITORING, ED: GLUCOSE-CAPILLARY: 55 mg/dL — AB (ref 65–99)

## 2016-08-21 LAB — POC URINE PREG, ED: Preg Test, Ur: NEGATIVE

## 2016-08-21 LAB — D-DIMER, QUANTITATIVE (NOT AT ARMC)

## 2016-08-21 MED ORDER — SODIUM CHLORIDE 0.9 % IV BOLUS (SEPSIS)
1000.0000 mL | Freq: Once | INTRAVENOUS | Status: AC
  Administered 2016-08-21: 1000 mL via INTRAVENOUS

## 2016-08-21 NOTE — Discharge Instructions (Signed)
Read the information below.  Your EKG shows a normal rhythm. Your x-ray did not show any acute abnormalities. Your initial blood sugars were slightly low; however, have improved.  It is important that you get plenty of rest, stay well hydrated, and be sure to eat snacks throughout the day.  Please follow up with a primary provider in the next 2-3 day, I have provided multiple resources for available providers in the area.  You may return to the Emergency Department at any time for worsening condition or any new symptoms that concern you. Return to ED if you develop chest pain, shortness of breath, loss of consciousness, neurologic symptoms, or any other new/concerning symptoms.

## 2016-08-21 NOTE — ED Provider Notes (Signed)
[facility]-EMERGENCY DEPT Provider Note   CSN: [id number] Arrival date & time: 08/21/16  1810  By signing my name below, I, Rosario AdieWilliam Andrew Hiatt, attest that this documentation has been prepared under the direction and in the presence of Arvilla MeresAshley Adelene Polivka, PA-C.  Electronically Signed: Rosario AdieWilliam Andrew Hiatt, ED Scribe. 08/21/16. 8:20 PM.  History   Chief Complaint Chief Complaint  Patient presents with  . Loss of Consciousness   The history is provided by the patient. No language interpreter was used.    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 Pt reports that she was at work tonight when she became moderately lightheaded, and shortly after she blacked out and fell to the ground. Pt notes that prior to her syncopal episode tonight that she had not eaten any type of food today, however, she has had adequate fluid intake. She denies nausea, chest pain, shortness of breath, or diaphoresis just prior to this event. During her fall she states that she sustained a small wound to her lips, but otherwise no other injuries or significant pain areas are noted. Bleeding is controlled to her lips. No reported seizure-like activity during her episode. Pt states that immediately upon waking up that she was still mildly lightheaded, but it has since resolved while in the ED. She additionally states that she is experiencing some generalized weakness as well, but that it is improving. No h/o similar syncopal episodes. No h/o prior seizures. No Hx of PE/DVT, recent long travel, surgery, fracture, prolonged immobilization. She is currently on BCP. Denies bowel/bladder incontinence, fever, trouble swallowing, visual disturbance, CP, SOB, abdominal pain, nausea, vomiting, dysuria, hematuria, numbness, facial asymmetry, speech difficulty, myalgias/arthralgias,  headache, neck pain, rash, leg swelling, leg lower extremity pain, hemoptysis, or any other associated symptoms. She is not on blood thinners.  History reviewed. No pertinent past medical history.  Patient Active Problem List   Diagnosis Date Noted  . IUD check up 12/20/2015  . Keloid 12/20/2015  . Adnexal mass 12/22/2014  . Vaginal discharge 11/21/2014  . Birth control counseling 04/26/2013  . Fibrocystic breast changes 10/07/2012  . Headache(784.0) 08/11/2012  . Family planning, Depo-Provera contraception monitoring/administration 05/18/2011  . RHINITIS, ALLERGIC 10/29/2006   Past Surgical History:  Procedure Laterality Date  . WISDOM TOOTH EXTRACTION     OB History    Gravida Para Term Preterm AB Living   0 0 0 0 0 0   SAB TAB Ectopic Multiple Live Births   0 0 0 0       Home Medications    Prior to Admission medications   Medication Sig Start Date End Date Taking? Authorizing Provider  Levonorgestrel 13.5 MG IUD by Intrauterine route.   Yes Historical Provider, MD  cephALEXin (KEFLEX) 500 MG capsule Take 1 capsule (500 mg total) by mouth 4 (four) times daily. Patient not taking: Reported on 08/21/2016 03/14/15   Elpidio AnisShari Upstill, PA-C  fluconazole (DIFLUCAN) 150 MG tablet Take 1 tablet (150 mg total) by mouth once. Patient not taking: Reported on 08/21/2016 11/21/14   Renee A Kuneff, DO  phenazopyridine (PYRIDIUM) 200 MG tablet Take 1 tablet (200 mg total) by mouth 3 (three) times daily. Patient not taking: Reported on 08/21/2016 03/14/15   Elpidio AnisShari Upstill, PA-C    Family History No family history on file.  Social History Social History  Substance Use Topics  .  Smoking status: Never Smoker  . Smokeless tobacco: Never Used  . Alcohol use No   Allergies   Patient has no known allergies.  Review of Systems Review of Systems  Constitutional: Negative for diaphoresis and fever.  HENT: Negative for trouble swallowing.   Eyes: Negative for visual disturbance.    Respiratory: Negative for shortness of breath.   Cardiovascular: Negative for chest pain and leg swelling.  Gastrointestinal: Negative for abdominal pain, nausea and vomiting.  Genitourinary: Negative for dysuria and hematuria.  Musculoskeletal: Negative for arthralgias, myalgias and neck pain.  Skin: Positive for wound. Negative for rash.  Neurological: Positive for syncope, weakness (generalized) and light-headedness ( resolved). Negative for seizures, facial asymmetry, speech difficulty, numbness and headaches.       Negative for bowel/bladder incontinence.  All other systems reviewed and are negative.  Physical Exam Updated Vital Signs BP 102/80 (BP Location: Left Arm)   Pulse 90   Temp 98.2 F (36.8 C) (Oral)   Resp 18   SpO2 100%   Physical Exam  Constitutional: She appears well-developed and well-nourished. No distress.  HENT:  Head: Normocephalic. Head is without raccoon's eyes and without Battle's sign.  Mouth/Throat: Oropharynx is clear and moist. No oropharyngeal exudate.  Superficial abrasion to the upper and lower lips on the right side. Bleeding is controlled. Not amenable to sutures. No evidence of tongue biting.   Eyes: Conjunctivae and EOM are normal. Pupils are equal, round, and reactive to light. Right eye exhibits no discharge. Left eye exhibits no discharge. No scleral icterus.  Neck: Normal range of motion and phonation normal. Neck supple. No neck rigidity. Normal range of motion present.  Cardiovascular: Normal rate, regular rhythm, normal heart sounds and intact distal pulses.   No murmur heard. Pulmonary/Chest: Effort normal and breath sounds normal. No stridor. No respiratory distress. She has no wheezes. She has no rales.  Abdominal: Soft. Bowel sounds are normal. She exhibits no distension. There is no tenderness. There is no rigidity, no rebound, no guarding and no CVA tenderness.  Musculoskeletal: Normal range of motion. She exhibits no tenderness.  No  midline spinal tenderness. No lower extremity swelling or TTP to the posterior calves bilaterally.   Lymphadenopathy:    She has no cervical adenopathy.  Neurological: She is alert. She is not disoriented. Coordination and gait normal. GCS eye subscore is 4. GCS verbal subscore is 5. GCS motor subscore is 6.  Mental Status: Alert, thought content appropriate, able to give a coherent history. Speech fluent without evidence of aphasia.  CN 2-12 grossly intact.  Moves extremities with ease. Sensation grossly equal and intact throughout. Strength 5/5 in all extremities.  Coordination nml with finger-to-nose b/l.  Skin: Skin is warm and dry. She is not diaphoretic.  Psychiatric: She has a normal mood and affect. Her behavior is normal.  Nursing note and vitals reviewed.  ED Treatments / Results  DIAGNOSTIC STUDIES: Oxygen Saturation is 100% on RA, normal by my interpretation.   COORDINATION OF CARE: 8:08 PM-Discussed next steps with pt. Pt verbalized understanding and is agreeable with the plan.   Labs (all labs ordered are listed, but only abnormal results are displayed) Labs Reviewed  CBG MONITORING, ED - Abnormal; Notable for the following:       Result Value   Glucose-Capillary 55 (*)    All other components within normal limits  BASIC METABOLIC PANEL  CBC WITH DIFFERENTIAL/PLATELET  D-DIMER, QUANTITATIVE (NOT AT [facility])  POC URINE PREG, ED  EKG  EKG Interpretation None      Radiology Dg Chest 2 View  Result Date: 08/21/2016 CLINICAL DATA:  20 year old female with syncope EXAM: CHEST  2 VIEW COMPARISON:  None. FINDINGS: The heart size and mediastinal contours are within normal limits. Both lungs are clear. The visualized skeletal structures are unremarkable. IMPRESSION: No active cardiopulmonary disease. Electronically Signed   By: Elgie CollardArash  Radparvar M.D.   On: 08/21/2016 21:34    Procedures Procedures   Medications Ordered in ED Medications  sodium chloride 0.9 %  bolus 1,000 mL (0 mLs Intravenous Stopped 08/21/16 2201)    Initial Impression / Assessment and Plan / ED Course  I have reviewed the triage vital signs and the nursing notes.  Pertinent labs & imaging results that were available during my care of the patient were reviewed by me and considered in my medical decision making (see chart for details).  Clinical Course as of Aug 22 4  Thu Aug 21, 2016  2200 DG Chest 2 View [AM]  2240 On re-evaluation patient endorses improvement following IV fluid hydration.   [AM]    Clinical Course User Index [AM] Lona KettleAshley Laurel Elvyn Krohn, PA-C    Patient presents to ED with complaint of syncopal episode at work. She had prodrome of feeling lightheaded.. Patient is afebrile and non-toxic appearing in NAD. Soft blood pressures, otherwise vital signs stable. Heart RRR. Lungs CTABL. Abdomen soft, non-tender. No focal neuro deficits on exam. Based on canadian head CT, no imaging warranted at this time. Based on canadian c-spine, no imaging warranted at this time. Will check basic labs, EKG to rule out arrhythmia, CXR to r/o structural etiology, and d-dimer given OCP use. IVF initiated.   Pt not orthostatic. Upreg negative. Initial CBG had glucose at 55, patient actively eating in room now. BMP shows blood sugar 75. CBC re-assuring. D-dimer nml, low suspicion for PE. EKG shows sinus rhythm, normal intervals, no STEMI. CXR shows nml heart size and mediastinum, nml skeletal structures. Suspect ?syncope secondary to decrease intake today. Discussed results and plan with pt. Pt endorses improvement following eating and IVF. Encouraged close follow up with PCP. Return precautions given. Pt voiced understanding and is agreeable.     Final Clinical Impressions(s) / ED Diagnoses   Final diagnoses:  Syncope, unspecified syncope type    New Prescriptions Discharge Medication List as of 08/21/2016 11:01 PM     I personally performed the services described in this  documentation, which was scribed in my presence. The recorded information has been reviewed and is accurate.     Lona Kettleshley Laurel Kandance Yano, PA-C 08/22/16 0005    Canary Brimhristopher J Tegeler, MD 08/22/16 1336

## 2016-08-21 NOTE — ED Triage Notes (Signed)
Patient here from work with complaints of near syncope episode while at work. Reports that she has not eaten today. Lip laceration after fall. Bleeding controlled.

## 2016-08-21 NOTE — ED Notes (Signed)
EKG given to EDP,Tegeler,MD. For review.

## 2016-08-21 NOTE — ED Notes (Signed)
Pt states she is feeling better than before and denies nausea, dizziness, or headache.  A&O x 4, ambulatory.

## 2016-08-21 NOTE — ED Notes (Signed)
Notified PA, pt. CBG 55.

## 2016-08-23 ENCOUNTER — Emergency Department (HOSPITAL_COMMUNITY): Payer: Medicaid Other

## 2016-08-23 ENCOUNTER — Emergency Department (HOSPITAL_COMMUNITY)
Admission: EM | Admit: 2016-08-23 | Discharge: 2016-08-24 | Disposition: A | Discharge status: Home / Self Care | Payer: Medicaid Other | Attending: Emergency Medicine | Admitting: Emergency Medicine

## 2016-08-23 ENCOUNTER — Encounter (HOSPITAL_COMMUNITY): Payer: Self-pay | Admitting: Nurse Practitioner

## 2016-08-23 DIAGNOSIS — Z79899 Other long term (current) drug therapy: Secondary | ICD-10-CM | POA: Diagnosis not present

## 2016-08-23 DIAGNOSIS — R1084 Generalized abdominal pain: Secondary | ICD-10-CM | POA: Diagnosis present

## 2016-08-23 DIAGNOSIS — K2900 Acute gastritis without bleeding: Secondary | ICD-10-CM

## 2016-08-23 LAB — CBC WITH DIFFERENTIAL/PLATELET
BASOS ABS: 0 10*3/uL (ref 0.0–0.1)
BASOS PCT: 0 %
EOS ABS: 0 10*3/uL (ref 0.0–0.7)
Eosinophils Relative: 0 %
HEMATOCRIT: 41.2 % (ref 36.0–46.0)
HEMOGLOBIN: 14.7 g/dL (ref 12.0–15.0)
Lymphocytes Relative: 20 %
Lymphs Abs: 1.2 10*3/uL (ref 0.7–4.0)
MCH: 33.3 pg (ref 26.0–34.0)
MCHC: 35.7 g/dL (ref 30.0–36.0)
MCV: 93.2 fL (ref 78.0–100.0)
Monocytes Absolute: 0.2 10*3/uL (ref 0.1–1.0)
Monocytes Relative: 4 %
NEUTROS ABS: 4.6 10*3/uL (ref 1.7–7.7)
NEUTROS PCT: 76 %
Platelets: 246 10*3/uL (ref 150–400)
RBC: 4.42 MIL/uL (ref 3.87–5.11)
RDW: 12.1 % (ref 11.5–15.5)
WBC: 6 10*3/uL (ref 4.0–10.5)

## 2016-08-23 LAB — COMPREHENSIVE METABOLIC PANEL
ALBUMIN: 5 g/dL (ref 3.5–5.0)
ALT: 10 U/L — ABNORMAL LOW (ref 14–54)
ANION GAP: 11 (ref 5–15)
AST: 25 U/L (ref 15–41)
Alkaline Phosphatase: 33 U/L — ABNORMAL LOW (ref 38–126)
BUN: 6 mg/dL (ref 6–20)
CHLORIDE: 107 mmol/L (ref 101–111)
CO2: 22 mmol/L (ref 22–32)
Calcium: 9.6 mg/dL (ref 8.9–10.3)
Creatinine, Ser: 0.88 mg/dL (ref 0.44–1.00)
GFR calc Af Amer: >60 mL/min (ref >60)
Glucose, Bld: 97 mg/dL (ref 65–99)
POTASSIUM: 4.4 mmol/L (ref 3.5–5.1)
Sodium: 140 mmol/L (ref 135–145)
TOTAL PROTEIN: 8.2 g/dL — AB (ref 6.5–8.1)
Total Bilirubin: 1.5 mg/dL — ABNORMAL HIGH (ref 0.3–1.2)

## 2016-08-23 LAB — I-STAT BETA HCG BLOOD, ED (MC, WL, AP ONLY): I-stat hCG, quantitative: <5 m[IU]/mL (ref <5)

## 2016-08-23 LAB — LIPASE, BLOOD: LIPASE: 22 U/L (ref 11–51)

## 2016-08-23 MED ORDER — MORPHINE SULFATE (PF) 4 MG/ML IV SOLN
4.0000 mg | Freq: Once | INTRAVENOUS | Status: AC
  Administered 2016-08-23: 4 mg via INTRAVENOUS
  Filled 2016-08-23: qty 1

## 2016-08-23 MED ORDER — IOPAMIDOL (ISOVUE-300) INJECTION 61%
INTRAVENOUS | Status: AC
  Administered 2016-08-23: 80 mL via INTRAVENOUS
  Filled 2016-08-23: qty 30

## 2016-08-23 MED ORDER — IOPAMIDOL (ISOVUE-300) INJECTION 61%
INTRAVENOUS | Status: AC
  Filled 2016-08-23: qty 100

## 2016-08-23 NOTE — ED Notes (Signed)
Informed pt about urine specimen

## 2016-08-23 NOTE — ED Provider Notes (Signed)
[facility]-EMERGENCY DEPT Provider Note   CSN: [id number] Arrival date & time: 08/23/16  2138  By signing my name below, I, Katherine Neal and Katherine Neal, attest that this documentation has been prepared under the direction and in the presence of [employer], PA-C. Electronically Signed: Orpah Neal and Katherine Neal, ED Scribe. 08/23/16. 11:04 PM.    History   Chief Complaint Chief Complaint  Patient presents with  . Abdominal Pain  . Emesis    HPI Comments:  Katherine Neal is a 20 y.o. female who presents to the Emergency Department complaining of sudden onset, moderate to severe, stabbing, constant, generalized abdominal pain onset today. Pt was seen in the ED 2 days ago for a syncopal episode and was not having abdominal pain at the time. Pt also reports nausea, vomiting, chills, fever which all started today. She states she has not been able to tolerate food or fluids. She states that her last BM was this morning. She denies taking anything to alleviate the symptoms. No recent sick contacts with similar symptoms. Pt denies Pshx to abdomen. She also denies diarrhea, vaginal discharge/bleeding, cough, dysuria, cough.    The history is provided by the patient. No language interpreter was used.    History reviewed. No pertinent past medical history.  Patient Active Problem List   Diagnosis Date Noted  . IUD check up 12/20/2015  . Keloid 12/20/2015  . Adnexal mass 12/22/2014  . Vaginal discharge 11/21/2014  . Birth control counseling 04/26/2013  . Fibrocystic breast changes 10/07/2012  . Headache(784.0) 08/11/2012  . Family planning, Depo-Provera contraception monitoring/administration 05/18/2011  . RHINITIS, ALLERGIC 10/29/2006    Past Surgical History:  Procedure Laterality Date  . WISDOM TOOTH EXTRACTION      OB History    Gravida Para Term Preterm AB Living   0 0 0 0 0 0   SAB TAB Ectopic Multiple Live Births   0 0 0 0         Home Medications     Prior to Admission medications   Medication Sig Start Date End Date Taking? Authorizing Provider  cephALEXin (KEFLEX) 500 MG capsule Take 1 capsule (500 mg total) by mouth 4 (four) times daily. Patient not taking: Reported on 08/21/2016 03/14/15   Elpidio Anis, PA-C  fluconazole (DIFLUCAN) 150 MG tablet Take 1 tablet (150 mg total) by mouth once. Patient not taking: Reported on 08/21/2016 11/21/14   Renee A Kuneff, DO  Levonorgestrel 13.5 MG IUD by Intrauterine route.    Historical Provider, MD  omeprazole (PRILOSEC) 20 MG capsule Take 1 capsule (20 mg total) by mouth daily. 08/24/16   Tinnie Gens Ira Busbin, PA-C  ondansetron (ZOFRAN) 4 MG tablet Take 1 tablet (4 mg total) by mouth every 6 (six) hours. 08/24/16   Eyvonne Mechanic, PA-C  phenazopyridine (PYRIDIUM) 200 MG tablet Take 1 tablet (200 mg total) by mouth 3 (three) times daily. Patient not taking: Reported on 08/21/2016 03/14/15   Elpidio Anis, PA-C    Family History History reviewed. No pertinent family history.  Social History Social History  Substance Use Topics  . Smoking status: Never Smoker  . Smokeless tobacco: Never Used  . Alcohol use No     Allergies   Patient has no known allergies.   Review of Systems Review of Systems  A complete 10 system review of systems was obtained and all systems are negative except as noted in the HPI and PMH.    Physical Exam Updated Vital Signs BP 108/74 (BP Location: Left  Arm)   Pulse 91   Temp 98.2 F (36.8 C) (Oral)   Resp 16   SpO2 96%   Physical Exam  Constitutional: She appears well-developed and well-nourished. No distress.  Appears very uncomfortable   HENT:  Head: Normocephalic and atraumatic.  Eyes: Conjunctivae are normal.  Neck: Neck supple.  Cardiovascular: Normal rate and regular rhythm.   No murmur heard. Pulmonary/Chest: Effort normal and breath sounds normal. No respiratory distress.  Abdominal: Soft. Bowel sounds are normal. There is tenderness. There  is guarding.  Exquisite TTP diffusely with guarding   Musculoskeletal: She exhibits no edema.  Neurological: She is alert.  Skin: Skin is warm and dry.  Psychiatric: She has a normal mood and affect.  Nursing note and vitals reviewed.    ED Treatments / Results   DIAGNOSTIC STUDIES: Oxygen Saturation is 100% on RA, normal by my interpretation.   COORDINATION OF CARE: 11:01 PM-Discussed next steps with pt. Pt verbalized understanding and is agreeable with the plan.    Labs (all labs ordered are listed, but only abnormal results are displayed) Labs Reviewed  COMPREHENSIVE METABOLIC PANEL - Abnormal; Notable for the following:       Result Value   Total Protein 8.2 (*)    ALT 10 (*)    Alkaline Phosphatase 33 (*)    Total Bilirubin 1.5 (*)    All other components within normal limits  URINALYSIS, ROUTINE W REFLEX MICROSCOPIC - Abnormal; Notable for the following:    APPearance HAZY (*)    Specific Gravity, Urine 1.042 (*)    Hgb urine dipstick MODERATE (*)    Ketones, ur 20 (*)    Leukocytes, UA TRACE (*)    Bacteria, UA RARE (*)    Squamous Epithelial / LPF 0-5 (*)    All other components within normal limits  LIPASE, BLOOD  CBC WITH DIFFERENTIAL/PLATELET  I-STAT BETA HCG BLOOD, ED ([facility], [facility], [facility] ONLY)    EKG  EKG Interpretation None       Radiology Ct Abdomen Pelvis W Contrast  Result Date: 08/23/2016 CLINICAL DATA:  Lower abdominal pain for 2 days. EXAM: CT ABDOMEN AND PELVIS WITH CONTRAST TECHNIQUE: Multidetector CT imaging of the abdomen and pelvis was performed using the standard protocol following bolus administration of intravenous contrast. CONTRAST:  1 ISOVUE-300 IOPAMIDOL (ISOVUE-300) INJECTION 61% COMPARISON:  Radiographs 08/23/2016.  Ultrasound 01/08/2016. FINDINGS: Lower chest: No acute abnormality. Hepatobiliary: No focal liver abnormality is seen. No gallstones, gallbladder wall thickening, or biliary dilatation. Pancreas: Unremarkable. No pancreatic  ductal dilatation or surrounding inflammatory changes. Spleen: Normal in size without focal abnormality. Adrenals/Urinary Tract: Adrenal glands are unremarkable. Kidneys are normal, without renal calculi, focal lesion, or hydronephrosis. Bladder is unremarkable. Stomach/Bowel: Stomach is within normal limits. Colon appears normal. The appendix is not discretely visible but there is no CT evidence of acute appendicitis. No evidence of bowel wall thickening, distention, or inflammatory changes. Vascular/Lymphatic: No significant vascular findings are present. No enlarged abdominal or pelvic lymph nodes. Reproductive: Unremarkable uterus with IUD. Mildly complex appearance of the adnexal regions, as well as small volume free pelvic fluid. Other: No abdominal wall hernia or abnormality. Musculoskeletal: No significant skeletal lesion. IMPRESSION: No acute findings. Mildly complex appearance of the adnexal regions, possibly ovarian cysts but poorly seen. Pelvic sonography may help if clinically warranted. Electronically Signed   By: Ellery Plunkaniel R Mitchell M.D.   On: 08/23/2016 23:19   Dg Abdomen Acute W/chest  Result Date: 08/23/2016 CLINICAL DATA:  20 year old female with  generalized abdominal pain. EXAM: DG ABDOMEN ACUTE W/ 1V CHEST COMPARISON:  Chest radiograph dated 08/21/2016 FINDINGS: The lungs are clear. There is no pleural effusion or pneumothorax. The cardiac silhouette is within normal limits. No bowel dilatation or evidence of obstruction. No free air or radiopaque calculi noted. An intrauterine device is noted over the pelvis. The soft tissues and osseous structures appear unremarkable. IMPRESSION: Negative abdominal radiographs.  No acute cardiopulmonary disease. Electronically Signed   By: Elgie CollardArash  Radparvar M.D.   On: 08/23/2016 23:08    Procedures Procedures (including critical care time)  Medications Ordered in ED Medications  morphine 4 MG/ML injection 4 mg (4 mg Intravenous Given 08/23/16 2232)    iopamidol (ISOVUE-300) 61 % injection (80 mLs Intravenous Contrast Given 08/23/16 2258)     Initial Impression / Assessment and Plan / ED Course  I have reviewed the triage vital signs and the nursing notes.  Pertinent labs & imaging results that were available during my care of the patient were reviewed by me and considered in my medical decision making (see chart for details).  Clinical Course     Labs:I-Stat beta hCG, CMP, CBC, UA  Imaging:  Consults:  Therapeutics:  Discharge Meds:   Assessment/Plan:  Pt presentation consistent with viral gastroenteritis. Patient has very reassuring laboratory analysis, reassuring vital signs, CT with no acute findings. Patient dramatically improved here in the ED, tolerating by mouth. Reevaluation of her abdomen shows no acute tenderness to palpation Her mother was at bedside, she would be discharged home with symptom Medicare instructions and strict return cautions. She verbalized understanding and agreement to today's plan had no further questions or concerns.       Final Clinical Impressions(s) / ED Diagnoses   Final diagnoses:  Acute gastritis without hemorrhage, unspecified gastritis type    New Prescriptions Discharge Medication List as of 08/24/2016 12:45 AM    START taking these medications   Details  omeprazole (PRILOSEC) 20 MG capsule Take 1 capsule (20 mg total) by mouth daily., Starting Sun 08/24/2016, Print    ondansetron (ZOFRAN) 4 MG tablet Take 1 tablet (4 mg total) by mouth every 6 (six) hours., Starting Sun 08/24/2016, Print       I personally performed the services described in this documentation, which was scribed in my presence. The recorded information has been reviewed and is accurate.   Eyvonne MechanicJeffrey Davonta Stroot, PA-C 08/24/16 [id number]    Tilden FossaElizabeth Rees, MD 08/27/16 [phone]

## 2016-08-23 NOTE — ED Triage Notes (Signed)
Pt c/o severe abdominal pain, onset 2 day ago, describes as "stomach being on fire" also reports N/V/D with fever and chills.

## 2016-08-23 NOTE — ED Notes (Signed)
No respiratory or acute distress noted alert and oriented x 3 no reaction to medication noted call light in reach. 

## 2016-08-24 LAB — URINALYSIS, ROUTINE W REFLEX MICROSCOPIC
BILIRUBIN URINE: NEGATIVE
Glucose, UA: NEGATIVE mg/dL
Ketones, ur: 20 mg/dL — AB
NITRITE: NEGATIVE
Protein, ur: NEGATIVE mg/dL
SPECIFIC GRAVITY, URINE: 1.042 — AB (ref 1.005–1.030)
pH: 7 (ref 5.0–8.0)

## 2016-08-24 MED ORDER — ONDANSETRON HCL 4 MG PO TABS: 4.0000 mg | ORAL_TABLET | Freq: Four times a day (QID) | ORAL | 0 refills | Status: DC

## 2016-08-24 MED ORDER — OMEPRAZOLE 20 MG PO CPDR: 20.0000 mg | DELAYED_RELEASE_CAPSULE | Freq: Every day | ORAL | 0 refills | Status: DC

## 2016-08-24 NOTE — Discharge Instructions (Signed)
Please read attached information. If you experience any new or worsening signs or symptoms please return to the emergency room for evaluation. Please follow-up with your primary care provider or specialist as discussed. Please use medication prescribed only as directed and discontinue taking if you have any concerning signs or symptoms.   °

## 2017-02-05 ENCOUNTER — Telehealth: Payer: Self-pay | Admitting: Family Medicine

## 2017-02-05 ENCOUNTER — Encounter: Payer: Self-pay | Admitting: Student

## 2017-02-05 ENCOUNTER — Ambulatory Visit (INDEPENDENT_AMBULATORY_CARE_PROVIDER_SITE_OTHER): Payer: Self-pay | Admitting: Student

## 2017-02-05 ENCOUNTER — Other Ambulatory Visit (HOSPITAL_COMMUNITY)
Admission: RE | Admit: 2017-02-05 | Discharge: 2017-02-05 | Disposition: A | Discharge status: Home / Self Care | Payer: Medicaid Other | Source: Ambulatory Visit | Attending: Family Medicine | Admitting: Family Medicine

## 2017-02-05 VITALS — BP 66/64 | HR 75 | Temp 98.7°F | Wt 93.0 lb

## 2017-02-05 DIAGNOSIS — R300 Dysuria: Secondary | ICD-10-CM

## 2017-02-05 DIAGNOSIS — N898 Other specified noninflammatory disorders of vagina: Secondary | ICD-10-CM | POA: Insufficient documentation

## 2017-02-05 DIAGNOSIS — Z7251 High risk heterosexual behavior: Secondary | ICD-10-CM

## 2017-02-05 DIAGNOSIS — Z30431 Encounter for routine checking of intrauterine contraceptive device: Secondary | ICD-10-CM

## 2017-02-05 LAB — POCT URINALYSIS DIP (MANUAL ENTRY)
BILIRUBIN UA: NEGATIVE
Glucose, UA: NEGATIVE mg/dL
Ketones, POC UA: NEGATIVE mg/dL
Leukocytes, UA: NEGATIVE
NITRITE UA: NEGATIVE
PH UA: 7 (ref 5.0–8.0)
Protein Ur, POC: 30 mg/dL — AB
Spec Grav, UA: 1.02 (ref 1.010–1.025)
Urobilinogen, UA: 0.2 E.U./dL

## 2017-02-05 LAB — POCT UA - MICROSCOPIC ONLY

## 2017-02-05 LAB — POCT WET PREP (WET MOUNT)
Clue Cells Wet Prep Whiff POC: POSITIVE
TRICHOMONAS WET PREP HPF POC: ABSENT

## 2017-02-05 MED ORDER — METRONIDAZOLE 500 MG PO TABS: 500.0000 mg | ORAL_TABLET | Freq: Two times a day (BID) | ORAL | 0 refills | Status: DC

## 2017-02-05 NOTE — Telephone Encounter (Signed)
Letter made and left up front for pick up, pt aware.

## 2017-02-05 NOTE — Progress Notes (Signed)
.  GEN

## 2017-02-05 NOTE — Assessment & Plan Note (Signed)
HIV, RPR today, will follow

## 2017-02-05 NOTE — Assessment & Plan Note (Addendum)
Vaginal discharge with benign exam - wet prep, GC/Ct today - wet prep shows BV, will treat with flagyl

## 2017-02-05 NOTE — Addendum Note (Signed)
Addended by: Tessie FassBUSICK, Adrianne Shackleton on: 02/05/2017 03:06 PM   Modules accepted: Orders

## 2017-02-05 NOTE — Progress Notes (Signed)
   Subjective:    Patient ID: Katherine Neal, female    DOB: June 18, 1996, 21 y.o.   MRN: [id number]   CC:STD check, vaginal discharge  HPI: 21 y/o F presents for vaginal discharge and desire forSTD check  Vaginal discharge/STD check - noted discharge 1 weeks ago with some itching  - no ted foul odor to discharge 2 days ago - she is sexually active with one female partner, occasionally uses condoms. IUD in place -  No abdominal pain, fevers or dysuria  Desire for STD check - wishes to be tested, does not suspect that she has an STD  Smoking status reviewed  Review of Systems  Per HPI, else denies recent illness, chest pain, shortness of breath,    Objective:  BP (!) 66/64   Pulse 75   Temp 98.7 F (37.1 C) (Oral)   Wt 93 lb (42.2 kg)   SpO2 98%   BMI 18.78 kg/m  Vitals and nursing note reviewed  General: NAD Cardiac: RRR,  Respiratory: CTAB, normal effort Abdomen: soft, nontender, nondistended Extremities: no edema or cyanosis. WWP. Skin: warm and dry, no rashes noted Neuro: alert and oriented, no focal deficits Pelvic: Normal EGBUS, normal vaginal canal with mild white discharge, normal cervix with no CMT IUD STRINGS NOT VISUALIZED, normal mobile uterus, normal adnexa with no masses, no adnexal tenderness     Assessment & Plan:    Vaginal discharge Vaginal discharge with benign exam - wet prep, GC/Ct today - wet prep shows BV, will treat with flagyl  High risk sexual behavior HIV, RPR today, will follow  IUD check up IUD strings not seen on exam today and previously not seen,. On US in 12/2015 her IUD was in place. If still unable to see strings at time of removal, will need to see Gyn    Alyssa A. Kennon RoundsHaney MD, MS Family Medicine Resident PGY-3 Pager [phone]

## 2017-02-05 NOTE — Patient Instructions (Addendum)
Follow up for pap smear You are due for a pap smear. Please make an appointment for this You will be called for any abnormal results Call the office with questions or concerns

## 2017-02-05 NOTE — Assessment & Plan Note (Signed)
IUD strings not seen on exam today and previously not seen,. On US in 12/2015 her IUD was in place. If still unable to see strings at time of removal, will need to see Gyn

## 2017-02-05 NOTE — Telephone Encounter (Signed)
Pt was seen this morning by Dr. Kennon RoundsHaney and needs a note for work. Please call pt when this is ready for pick up. ep

## 2017-02-06 LAB — CERVICOVAGINAL ANCILLARY ONLY
Chlamydia: NEGATIVE
Neisseria Gonorrhea: NEGATIVE

## 2017-02-06 LAB — HIV ANTIBODY (ROUTINE TESTING W REFLEX): HIV Screen 4th Generation wRfx: NONREACTIVE

## 2017-02-06 LAB — RPR: RPR Ser Ql: NONREACTIVE

## 2017-02-09 ENCOUNTER — Telehealth: Payer: Self-pay | Admitting: Student

## 2017-02-09 NOTE — Telephone Encounter (Signed)
Pt contacted and informed of negative std tests.

## 2017-02-09 NOTE — Telephone Encounter (Signed)
Please call the patient and tell her that her RPR, HIV and GC/Ct were negative

## 2017-02-24 ENCOUNTER — Encounter: Payer: Medicaid Other | Admitting: Family Medicine

## 2017-03-03 ENCOUNTER — Encounter: Payer: Medicaid Other | Admitting: Family Medicine

## 2017-08-05 ENCOUNTER — Other Ambulatory Visit: Payer: Self-pay

## 2017-08-05 ENCOUNTER — Ambulatory Visit (INDEPENDENT_AMBULATORY_CARE_PROVIDER_SITE_OTHER): Payer: Self-pay | Admitting: Family Medicine

## 2017-08-05 VITALS — BP 80/64 | HR 105 | Temp 98.1°F | Ht 59.0 in | Wt 99.2 lb

## 2017-08-05 DIAGNOSIS — N898 Other specified noninflammatory disorders of vagina: Secondary | ICD-10-CM

## 2017-08-05 LAB — POCT WET PREP (WET MOUNT)
CLUE CELLS WET PREP WHIFF POC: NEGATIVE
TRICHOMONAS WET PREP HPF POC: ABSENT

## 2017-08-05 MED ORDER — FLUCONAZOLE 150 MG PO TABS: 150.0000 mg | ORAL_TABLET | Freq: Once | ORAL | 0 refills | Status: AC

## 2017-08-05 NOTE — Patient Instructions (Addendum)
Today we talk about your vaginal discharge that you have been having for the last three days. I performed a wet mount to see that you have a yeast infection. We discussed the likely causes for this. I will give you a medication called fluconazole that you will take for one dose. Please make a follow up appointment if you do not feel any better in 3-4 days.

## 2017-08-17 ENCOUNTER — Encounter: Payer: Self-pay | Admitting: Family Medicine

## 2017-08-17 NOTE — Progress Notes (Signed)
   HPI 21 year old who presents with an approximately one week history of vaginal discharge. She states that initially the discharge started out a little thicker than usual and she didn't think much of it. Her discharge then turned a whitish shade and she developed a lot of itching in her vaginal area. She states that she is not at risk for std's and that she has been having protected sex with a single partner for the last several months. She has an IUD for birth control. She does not want an std check. She has noticed some erythema in this area. A wet prep was performed which showed moderate bacteria and few yeasts. Negative whiff and no clue cells. Trich absent.   CC: vaginal discharge   ROS: Review of Systems  Constitutional: Negative for chills and fever.  HENT: Negative for congestion, hearing loss and sinus pain.   Eyes: Negative for pain, discharge and redness.  Respiratory: Negative for cough and sputum production.   Cardiovascular: Negative for palpitations and orthopnea.  Gastrointestinal: Negative for abdominal pain, constipation, diarrhea, nausea and vomiting.  Genitourinary: Positive for urgency. Negative for flank pain, frequency and hematuria.  Musculoskeletal: Negative for myalgias and neck pain.  Skin: Positive for itching (in vaginal area). Negative for rash.  Neurological: Negative for dizziness, weakness and headaches.  Psychiatric/Behavioral: Negative for depression.    Review of Systems See HPI for ROS.   CC, SH/smoking status, and VS noted  Objective: BP (!) 80/64   Pulse (!) 105   Temp 98.1 F (36.7 C) (Oral)   Ht 4\' 11"  (1.499 m)   Wt 99 lb 3.2 oz (45 kg)   SpO2 97%   BMI 20.04 kg/m  Gen: NAD, alert, cooperative, and pleasant. HEENT: NCAT, EOMI, PERRL CV: RRR, no murmur Resp: CTAB, no wheezes, non-labored Abd: SNTND, BS present, no guarding or organomegaly Ext: No edema, warm Neuro: Alert and oriented, Speech clear, No gross  deficits   Assessment and plan:  Vaginal discharge Given her wet prep, safe sex practices, and symptoms the patient's symptoms are felt to be most consistent with a yeast infection. Will treat with fluconazole 150mg  tablet once. Patient given return to clinic precautions if symptoms do not improve in 3-4 days.   Orders Placed This Encounter  Procedures  . POCT Wet Prep [facility](Wet Mount)    Meds ordered this encounter  Medications  . fluconazole (DIFLUCAN) 150 MG tablet    Sig: Take 1 tablet (150 mg total) by mouth once for 1 dose.    Dispense:  1 tablet    Refill:  0     Myrene BuddyJacob Trenisha Lafavor MD PGY-1 Family Medicine Resident 08/17/2017 9:26 AM

## 2017-08-17 NOTE — Assessment & Plan Note (Signed)
Given her wet prep, safe sex practices, and symptoms the patient's symptoms are felt to be most consistent with a yeast infection. Will treat with fluconazole 150mg  tablet once. Patient given return to clinic precautions if symptoms do not improve in 3-4 days.

## 2018-03-02 ENCOUNTER — Encounter: Payer: Medicaid Other | Admitting: Family Medicine

## 2018-06-01 IMAGING — CT CT ABD-PELV W/ CM
2 of 4 series · 16 of 46 positions shown, 18 images · IV contrast (ISOVUE)
Comparison: Radiographs 08/23/2016.  Ultrasound 01/08/2016.

CLINICAL DATA: Lower abdominal pain for 2 days.

EXAM:
CT ABDOMEN AND PELVIS WITH CONTRAST
TECHNIQUE: Multidetector CT imaging of the abdomen and pelvis was performed
using the standard protocol following bolus administration of
intravenous contrast.
CONTRAST:  1 3ZQ0C4-XPP IOPAMIDOL (3ZQ0C4-XPP) INJECTION 61%

[Series 2: abd/pel with · axial · 0.68mm/px · z∈[+926,+1282]mm · 13 of 81 slices shown, 15 images]
[im 5/81  soft-tissue]
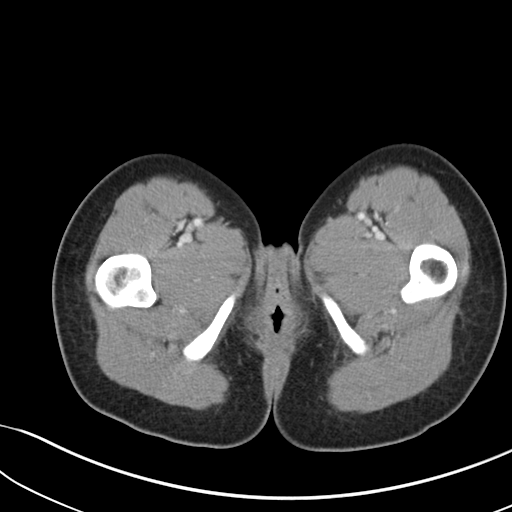
[im 5/81  bone]
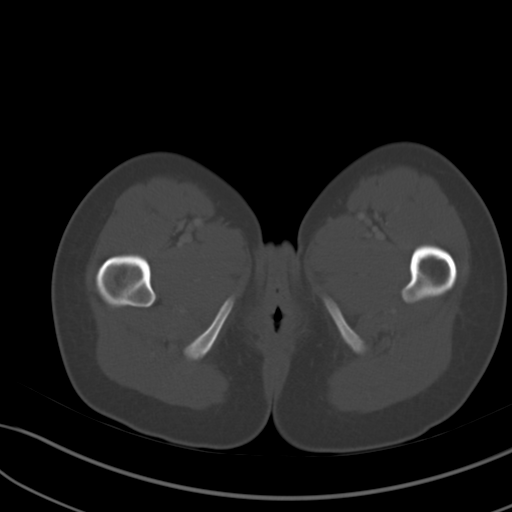
[im 9/81  soft-tissue]
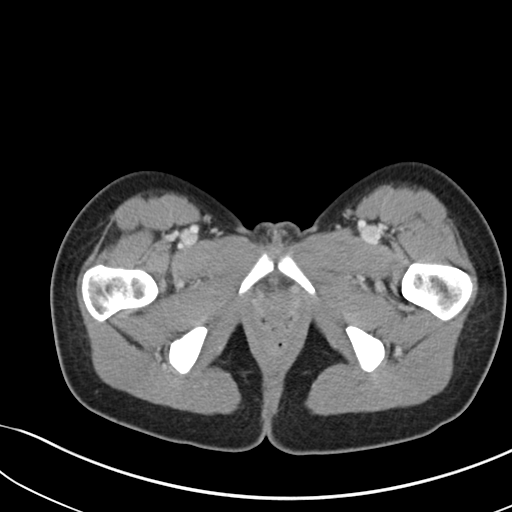
[im 18/81  soft-tissue]
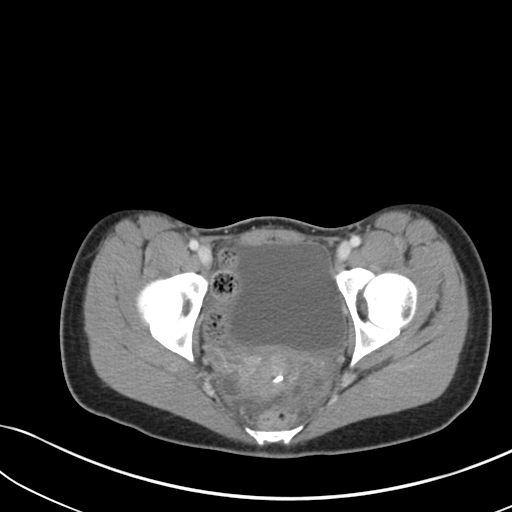
[im 23/81  soft-tissue]
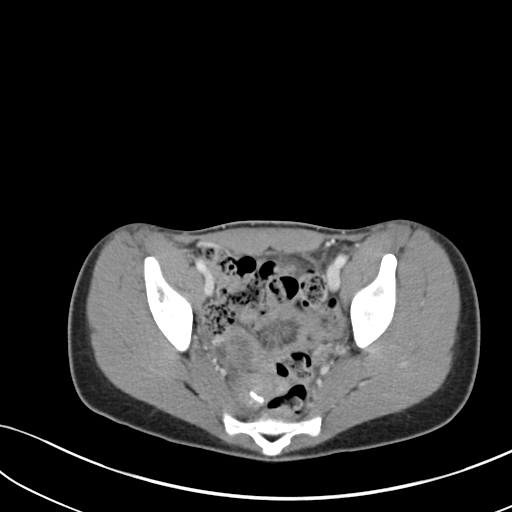
[im 27/81  soft-tissue]
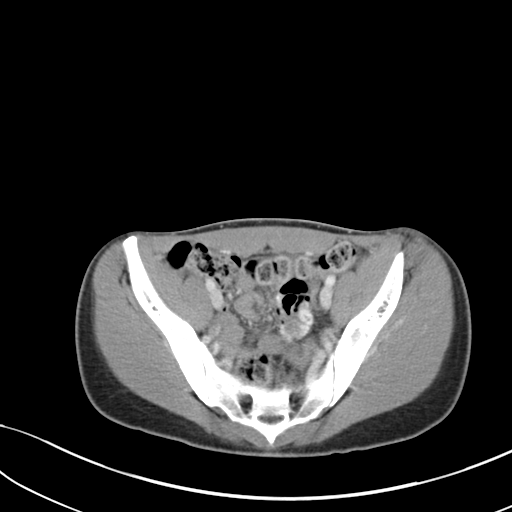
[im 36/81  soft-tissue]
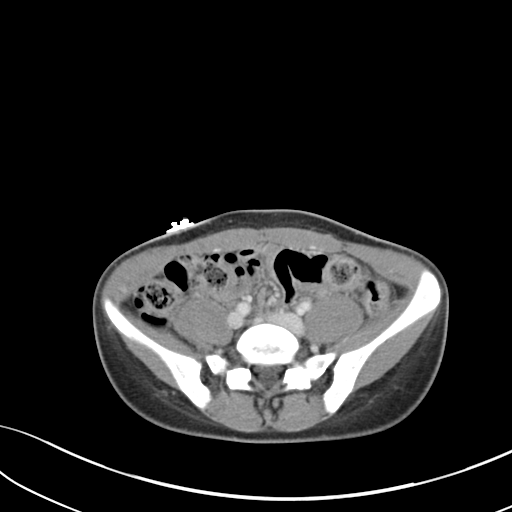
[im 41/81  soft-tissue]
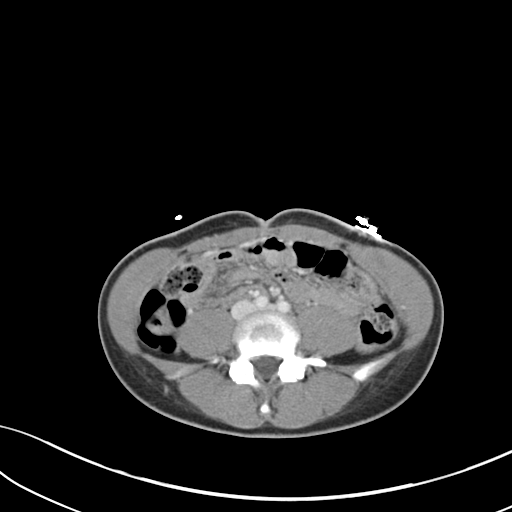
[im 45/81  soft-tissue]
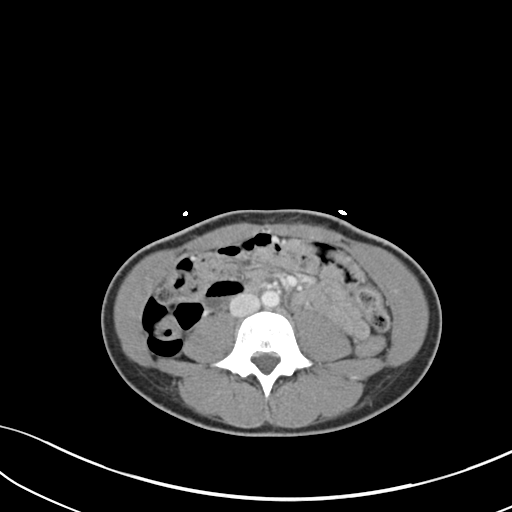
[im 54/81  soft-tissue]
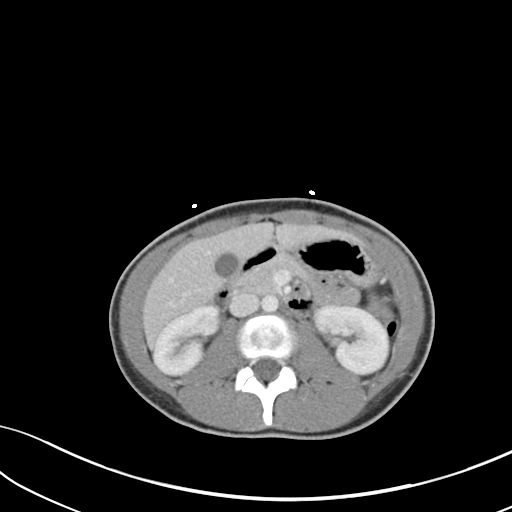
[im 54/81  bone]
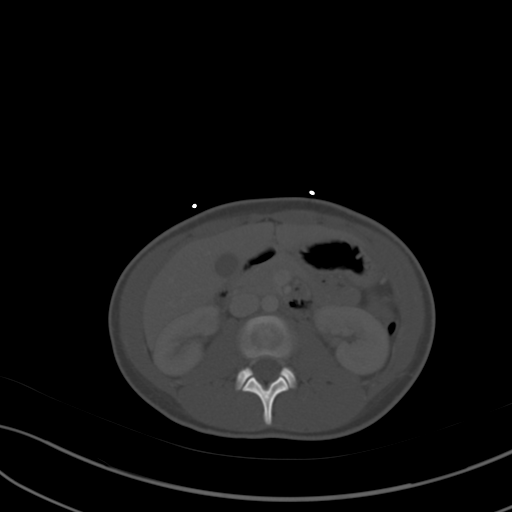
[im 58/81  soft-tissue]
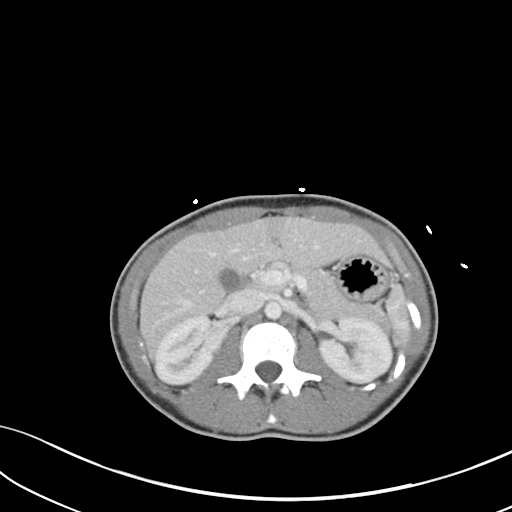
[im 63/81  soft-tissue]
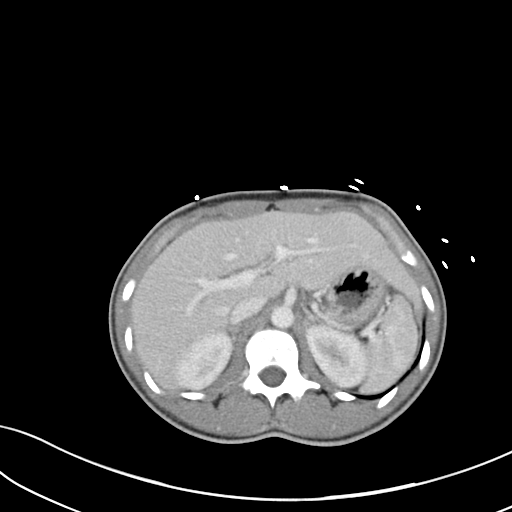
[im 72/81  soft-tissue]
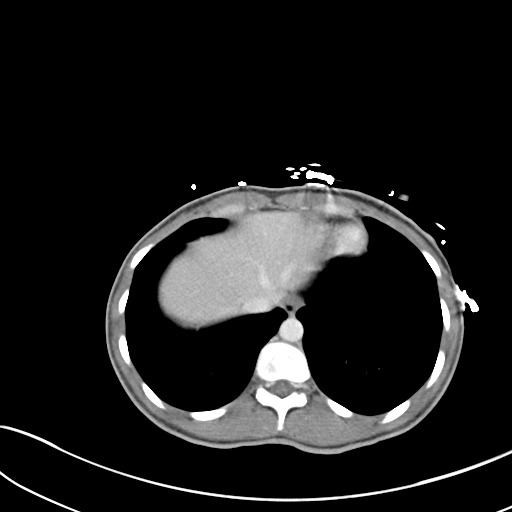
[im 76/81  soft-tissue]
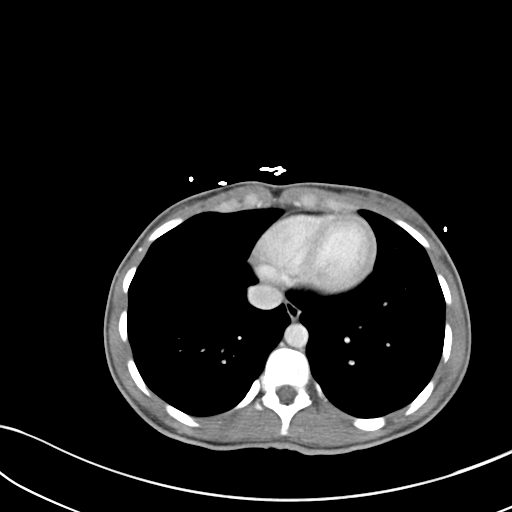

[Series 3: coronal a/|p · coronal · 0.64mm/px · 3 of 105 slices shown]
[im 35/105  soft-tissue]
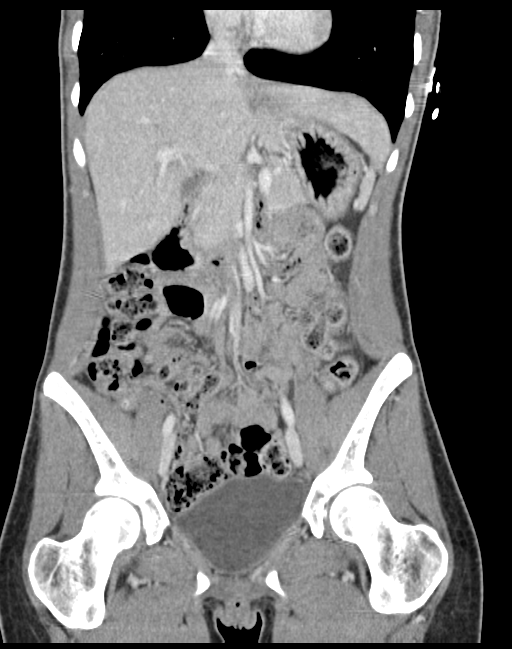
[im 47/105  soft-tissue]
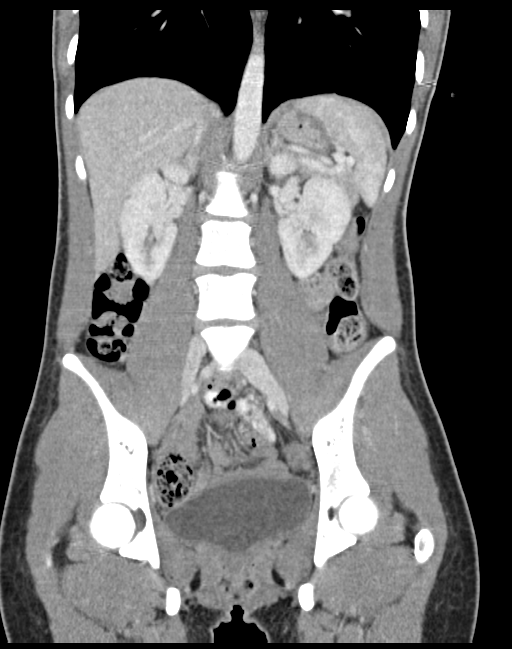
[im 58/105  soft-tissue]
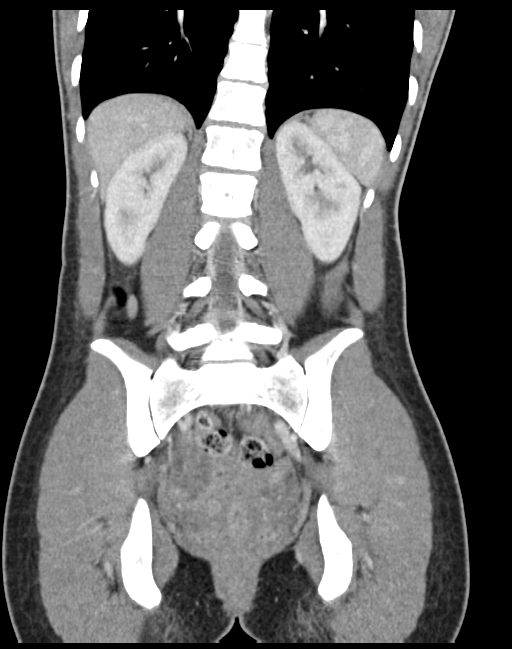

[16 of 46 positions shown; findings below may reference images not displayed]

FINDINGS: Lower chest: No acute abnormality.

Hepatobiliary: No focal liver abnormality is seen. No gallstones,
gallbladder wall thickening, or biliary dilatation.

Pancreas: Unremarkable. No pancreatic ductal dilatation or
surrounding inflammatory changes.

Spleen: Normal in size without focal abnormality.

Adrenals/Urinary Tract: Adrenal glands are unremarkable. Kidneys are
normal, without renal calculi, focal lesion, or hydronephrosis.
Bladder is unremarkable.

Stomach/Bowel: Stomach is within normal limits. Colon appears
normal. The appendix is not discretely visible but there is no CT
evidence of acute appendicitis. No evidence of bowel wall
thickening, distention, or inflammatory changes.

Vascular/Lymphatic: No significant vascular findings are present. No
enlarged abdominal or pelvic lymph nodes.

Reproductive: Unremarkable uterus with IUD. Mildly complex
appearance of the adnexal regions, as well as small volume free
pelvic fluid.

Other: No abdominal wall hernia or abnormality.

Musculoskeletal: No significant skeletal lesion.
IMPRESSION: No acute findings. Mildly complex appearance of the adnexal regions,
possibly ovarian cysts but poorly seen. Pelvic sonography may help
if clinically warranted.

## 2018-06-01 IMAGING — CR DG ABDOMEN ACUTE W/ 1V CHEST
3 series · 3 of 3 positions shown · non-contrast
Comparison: Chest radiograph dated 08/21/2016

CLINICAL DATA: 20-year-old female with generalized abdominal pain.

EXAM:
DG ABDOMEN ACUTE W/ 1V CHEST

[w chest pa]
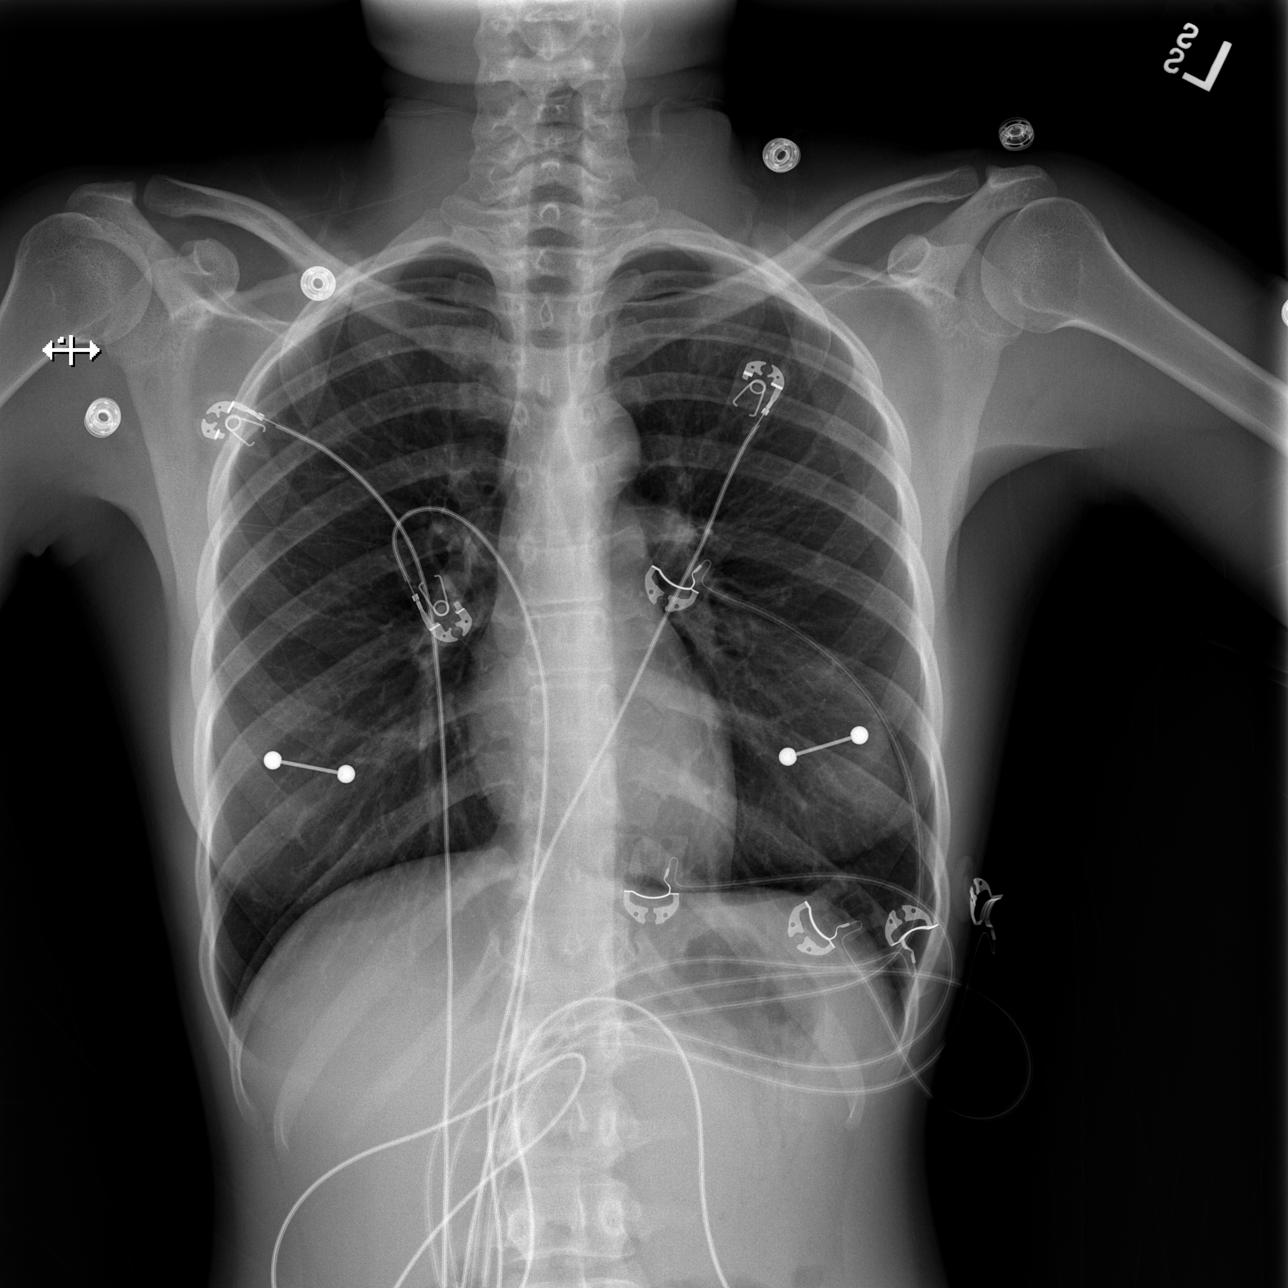

[w abdomen upright]
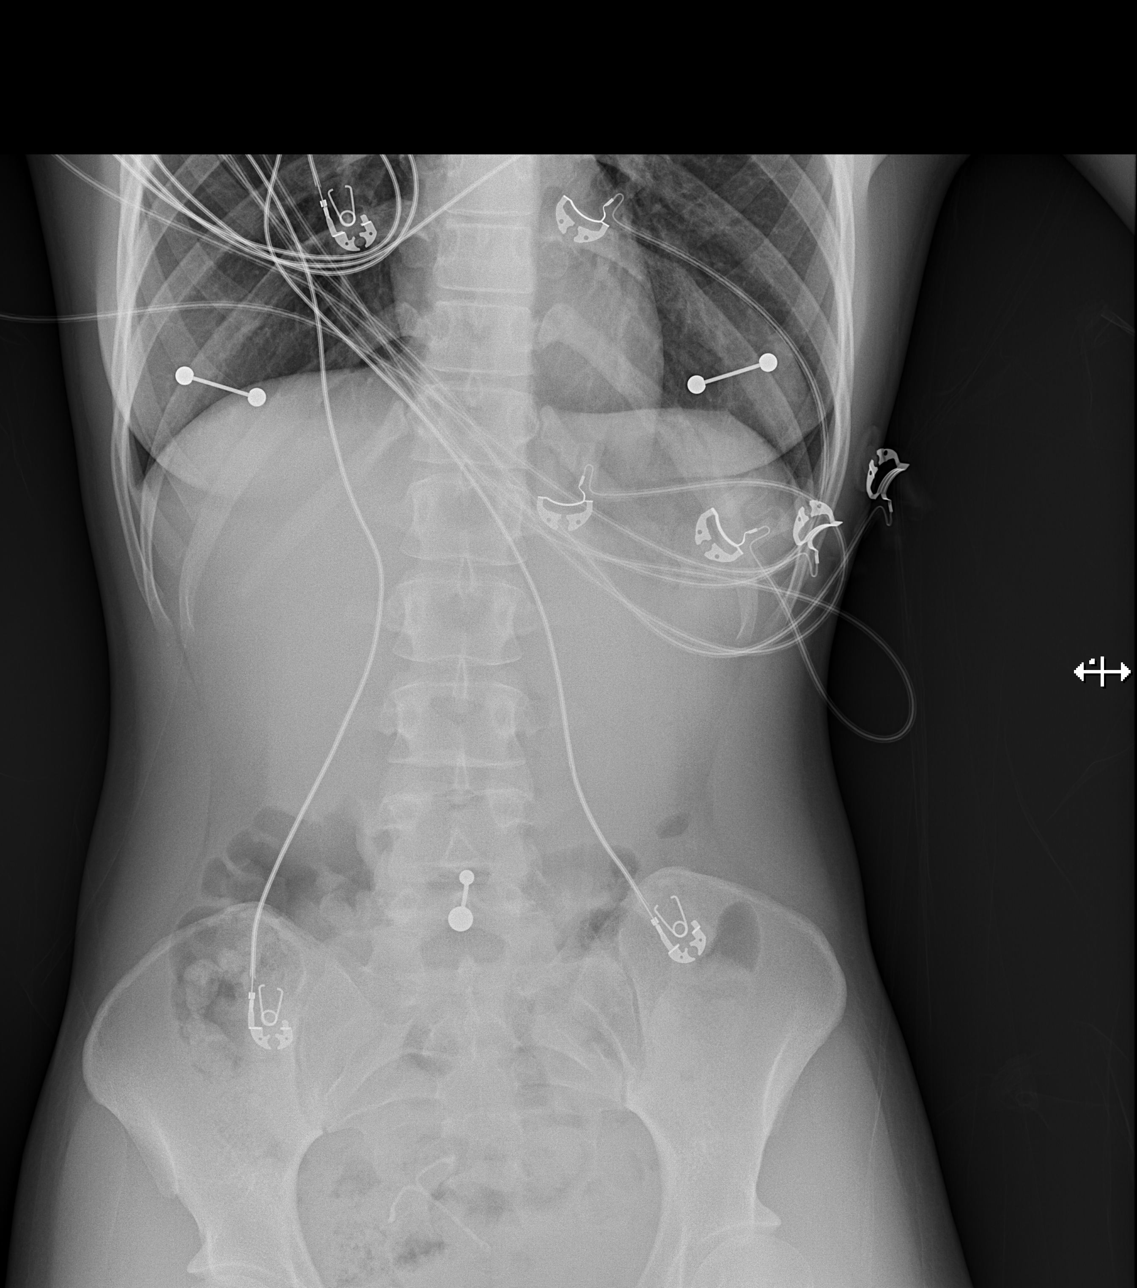

[t abdomen supine]
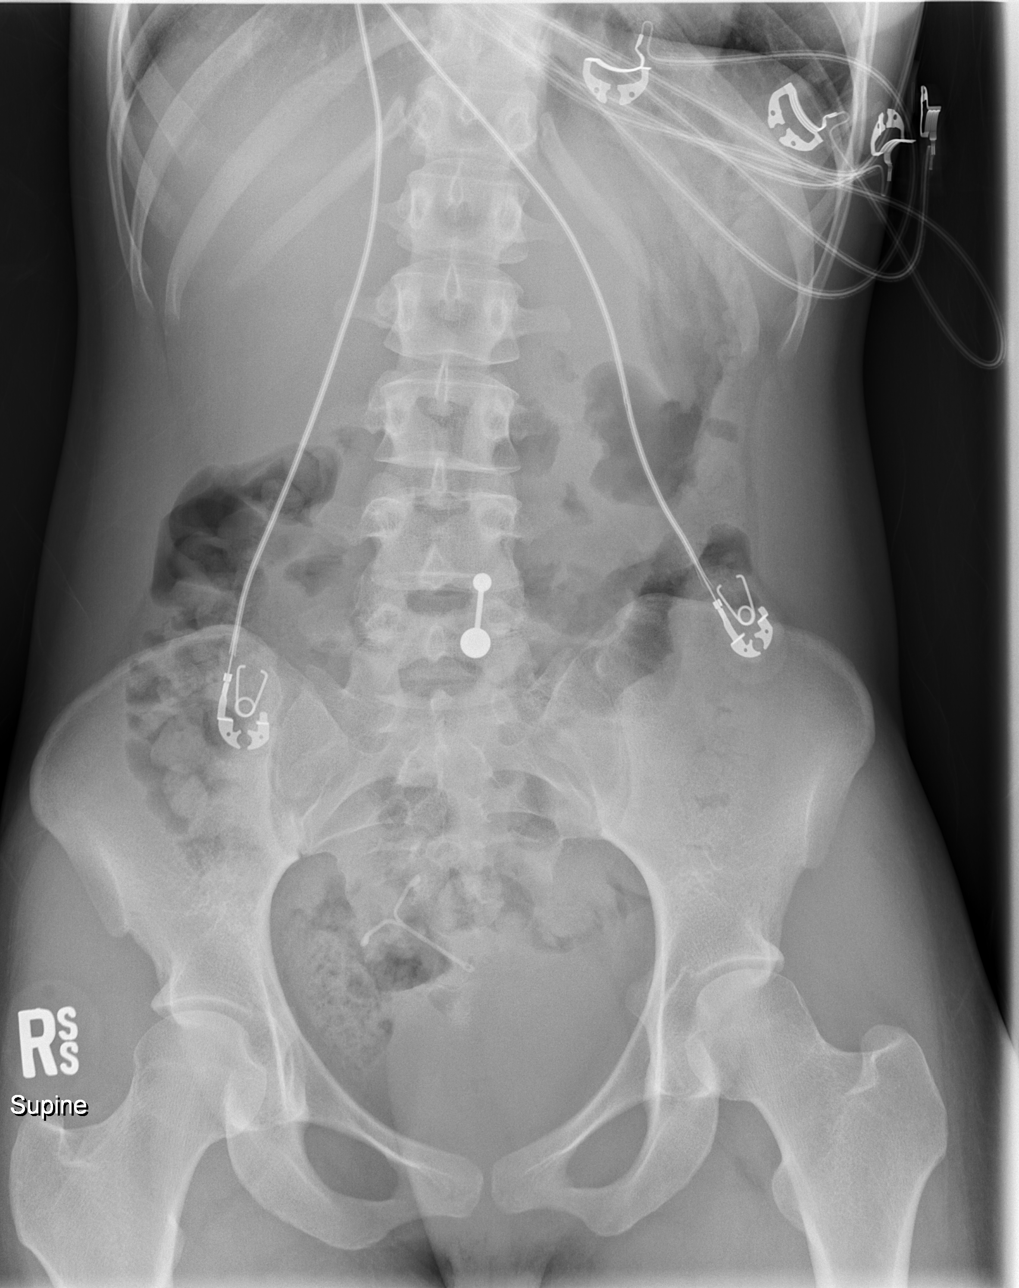

[3 of 3 positions shown; findings below may reference images not displayed]

FINDINGS: The lungs are clear. There is no pleural effusion or pneumothorax.
The cardiac silhouette is within normal limits.

No bowel dilatation or evidence of obstruction. No free air or
radiopaque calculi noted. An intrauterine device is noted over the
pelvis. The soft tissues and osseous structures appear unremarkable.
IMPRESSION: Negative abdominal radiographs.  No acute cardiopulmonary disease.

## 2018-08-20 ENCOUNTER — Ambulatory Visit: Payer: Medicaid Other | Admitting: Family Medicine

## 2018-11-03 ENCOUNTER — Other Ambulatory Visit (HOSPITAL_COMMUNITY)
Admission: RE | Admit: 2018-11-03 | Discharge: 2018-11-03 | Disposition: A | Discharge status: Home / Self Care | Payer: BLUE CROSS/BLUE SHIELD | Source: Ambulatory Visit | Attending: Family Medicine | Admitting: Family Medicine

## 2018-11-03 ENCOUNTER — Ambulatory Visit (INDEPENDENT_AMBULATORY_CARE_PROVIDER_SITE_OTHER): Payer: BLUE CROSS/BLUE SHIELD | Admitting: Family Medicine

## 2018-11-03 ENCOUNTER — Other Ambulatory Visit: Payer: Self-pay

## 2018-11-03 VITALS — BP 100/68 | HR 75 | Temp 99.0°F | Wt 100.0 lb

## 2018-11-03 DIAGNOSIS — Z202 Contact with and (suspected) exposure to infections with a predominantly sexual mode of transmission: Secondary | ICD-10-CM | POA: Insufficient documentation

## 2018-11-03 DIAGNOSIS — N760 Acute vaginitis: Secondary | ICD-10-CM | POA: Diagnosis not present

## 2018-11-03 DIAGNOSIS — B9689 Other specified bacterial agents as the cause of diseases classified elsewhere: Secondary | ICD-10-CM | POA: Diagnosis not present

## 2018-11-03 DIAGNOSIS — Z23 Encounter for immunization: Secondary | ICD-10-CM | POA: Diagnosis not present

## 2018-11-03 LAB — POCT WET PREP (WET MOUNT)
Clue Cells Wet Prep Whiff POC: POSITIVE
TRICHOMONAS WET PREP HPF POC: ABSENT

## 2018-11-03 NOTE — Patient Instructions (Addendum)
It was a pleasure to see you today! Thank you for choosing [facility] for your primary care. Katherine Neal was seen for STD check and Mirena verification. Come back to the clinic for Pap smear  Today we ran a number of test to screen for sexually transmitted diseases and check that your IUD was placed in December 2017.  This should be good for 5 years from the date that was put in.  You are due for a Pap screen so please schedule appointment to come back and get this done.  If you prefer a female doctor for that we do have options available for you.  If we did any lab work today that did not result today, one of two things will happen.  1. If everything is normal, you will get a letter in mail sent to the address in your chart with the results for your records.  It is important to keep your address up to date as that is where we will send results.  2. If the results require some sort of discussion, my nurses or myself will call you on the phone number listed in your records.  It is important to keep your phone number up to date in our system as this is how we will try to reach you.  If we cannot reach you on the phone, we will try to send you a letter in the mail so please enable to voicemail function of your phone.  If you don't hear from [place] in two weeks, please give [place] a call to verify your results. Otherwise, we look forward to seeing you again at your next visit. If you have any questions or concerns before then, please call the clinic at [phone].   Please bring all your medications to every doctors visit   Sign up for My Chart to have easy access to your labs results, and communication with your Primary care physician.     Please check-out at the front desk before leaving the clinic.     Best,  Dr. Marthenia Rolling FAMILY MEDICINE RESIDENT - PGY2 11/03/2018 4:09 PM

## 2018-11-04 DIAGNOSIS — N760 Acute vaginitis: Secondary | ICD-10-CM

## 2018-11-04 DIAGNOSIS — Z202 Contact with and (suspected) exposure to infections with a predominantly sexual mode of transmission: Secondary | ICD-10-CM | POA: Insufficient documentation

## 2018-11-04 DIAGNOSIS — Z23 Encounter for immunization: Secondary | ICD-10-CM | POA: Insufficient documentation

## 2018-11-04 DIAGNOSIS — B9689 Other specified bacterial agents as the cause of diseases classified elsewhere: Secondary | ICD-10-CM | POA: Insufficient documentation

## 2018-11-04 LAB — HIV ANTIBODY (ROUTINE TESTING W REFLEX): HIV Screen 4th Generation wRfx: NONREACTIVE

## 2018-11-04 MED ORDER — METRONIDAZOLE 500 MG PO TABS: 500.0000 mg | ORAL_TABLET | Freq: Two times a day (BID) | ORAL | 0 refills | Status: DC

## 2018-11-04 NOTE — Assessment & Plan Note (Signed)
Patient elected self swab, wanted to check "just to be sure".  Claims no symptoms (rash/bleeding/sores/dysuria).  Says she is safe, will reschedule pap

## 2018-11-04 NOTE — Progress Notes (Signed)
    Subjective:  Katherine Neal is a 23 y.o. female who presents to the [facility] today with a chief complaint of STD screen.   HPI: Possible exposure to STD Patient elected self swab, wanted to check "just to be sure".  Claims no symptoms (rash/bleeding/sores/dysuria/discharge).  Says she is safe, will reschedule pap.  Has been sexually active, is using Mirena.  Has not been using barrier protection.  Says she has been with the same partner and does not have a specific suspicions but just wants to be sure.  Need for immunization against influenza Patient consents to flu   Objective:  Physical Exam: BP 100/68   Pulse 75   Temp 99 F (37.2 C) (Oral)   Wt 100 lb (45.4 kg)   SpO2 99%   BMI 20.20 kg/m   Gen: NAD, resting comfortably GU: Exam deferred patient claims no specific symptoms and wanted to self swab. MSK: no edema, cyanosis, or clubbing noted Skin: warm, dry Neuro: grossly normal, moves all extremities Psych: Normal affect and thought content  Results for orders placed or performed in visit on 11/03/18 (from the past 72 hour(s))  POCT Wet Prep [facility] [place])     Status: Abnormal   Collection Time: 11/03/18  4:05 PM  Result Value Ref Range   Source Wet Prep POC VAG    WBC, Wet Prep HPF POC NONE    Bacteria Wet Prep HPF POC Moderate (A) Few   Clue Cells Wet Prep HPF POC Moderate (A) None   Clue Cells Wet Prep Whiff POC Positive Whiff    Yeast Wet Prep HPF POC None None   Trichomonas Wet Prep HPF POC Absent Absent  HIV antibody (with reflex)     Status: None   Collection Time: 11/03/18  4:49 PM  Result Value Ref Range   HIV Screen 4th Generation wRfx Non Reactive Non Reactive     Assessment/Plan:  Possible exposure to STD Patient elected self swab, wanted to check "just to be sure".  Claims no symptoms (rash/bleeding/sores/dysuria).  Says she is safe, will reschedule pap  Need for immunization against influenza Patient consents to flu  BV (bacterial vaginosis) Swab  positive, patient notified on VM by Dr. Parke Simmers as she had requested.  [place] w/ warning to not drink alcohol   Marthenia Rolling, DO FAMILY MEDICINE RESIDENT - PGY2 11/04/2018 8:47 AM

## 2018-11-04 NOTE — Assessment & Plan Note (Signed)
Patient consents to flu

## 2018-11-04 NOTE — Assessment & Plan Note (Signed)
Swab positive, patient notified on VM by Dr. Parke Simmers as she had requested.  [place] w/ warning to not drink alcohol

## 2018-11-05 LAB — CERVICOVAGINAL ANCILLARY ONLY
Chlamydia: NEGATIVE
Neisseria Gonorrhea: NEGATIVE

## 2018-11-06 ENCOUNTER — Encounter: Payer: Self-pay | Admitting: Family Medicine

## 2018-11-06 DIAGNOSIS — K219 Gastro-esophageal reflux disease without esophagitis: Secondary | ICD-10-CM

## 2018-11-06 DIAGNOSIS — R112 Nausea with vomiting, unspecified: Secondary | ICD-10-CM

## 2018-11-08 MED ORDER — ONDANSETRON HCL 4 MG PO TABS: 4.0000 mg | ORAL_TABLET | Freq: Four times a day (QID) | ORAL | 0 refills | Status: DC

## 2018-11-08 MED ORDER — OMEPRAZOLE 20 MG PO CPDR: 20.0000 mg | DELAYED_RELEASE_CAPSULE | Freq: Every day | ORAL | 0 refills | Status: DC

## 2018-11-08 NOTE — Telephone Encounter (Signed)
Refill chronic meds.  Patient will be notified via mychart and reminded to get pap/tdap.  -Dr. Parke Simmers

## 2018-11-19 ENCOUNTER — Encounter: Payer: Self-pay | Admitting: Family Medicine

## 2018-11-21 ENCOUNTER — Encounter: Payer: Self-pay | Admitting: Family Medicine

## 2018-11-22 ENCOUNTER — Other Ambulatory Visit: Payer: Self-pay

## 2018-11-22 ENCOUNTER — Telehealth: Payer: Self-pay | Admitting: Family Medicine

## 2018-11-22 DIAGNOSIS — R112 Nausea with vomiting, unspecified: Secondary | ICD-10-CM

## 2018-11-22 DIAGNOSIS — K219 Gastro-esophageal reflux disease without esophagitis: Secondary | ICD-10-CM

## 2018-11-22 DIAGNOSIS — N3000 Acute cystitis without hematuria: Secondary | ICD-10-CM

## 2018-11-22 MED ORDER — OMEPRAZOLE 20 MG PO CPDR: 20.0000 mg | DELAYED_RELEASE_CAPSULE | Freq: Every day | ORAL | 0 refills | Status: AC

## 2018-11-22 MED ORDER — ONDANSETRON HCL 4 MG PO TABS: 4.0000 mg | ORAL_TABLET | Freq: Four times a day (QID) | ORAL | 0 refills | Status: DC

## 2018-11-22 MED ORDER — CEPHALEXIN 500 MG PO CAPS: 500.0000 mg | ORAL_CAPSULE | Freq: Four times a day (QID) | ORAL | 0 refills | Status: AC

## 2018-11-22 NOTE — Telephone Encounter (Signed)
Patient sent message via my chart explaining dysuria and urgency with urination.  This is consistent with prior diagnosed UTIs which are treated well with Keflex.  Keflex given Dr. Parke Simmers

## 2018-12-06 ENCOUNTER — Ambulatory Visit: Payer: BLUE CROSS/BLUE SHIELD | Admitting: Family Medicine

## 2018-12-26 ENCOUNTER — Encounter: Payer: Self-pay | Admitting: Family Medicine

## 2019-07-13 ENCOUNTER — Ambulatory Visit: Payer: BLUE CROSS/BLUE SHIELD | Admitting: Family Medicine

## 2019-07-13 ENCOUNTER — Telehealth: Payer: BC Managed Care – PPO | Admitting: Nurse Practitioner

## 2019-07-13 DIAGNOSIS — R110 Nausea: Secondary | ICD-10-CM

## 2019-07-13 MED ORDER — ONDANSETRON HCL 4 MG PO TABS: 4.0000 mg | ORAL_TABLET | Freq: Three times a day (TID) | ORAL | 0 refills | Status: DC | PRN

## 2019-07-13 NOTE — Progress Notes (Signed)
We are sorry that you are not feeling well. Here is how we plan to help!  Based on what you have shared with me it looks like you have a Virus that is irritating your GI tract.  Nausea is just the feeling of being sickVomiting is the forceful emptying of a portion of the stomach's content through the mouth.  Although nausea and vomiting can make you feel miserable, it's important to remember that these are not diseases, but rather symptoms of an underlying illness.  When we treat short term symptoms, we always caution that any symptoms that persist should be fully evaluated in a medical office.  I have prescribed a medication that will help alleviate your symptoms and allow you to stay hydrated:  Zofran 4 mg 1 tablet every 8 hours as needed for nausea and vomiting  HOME CARE:  Drink clear liquids.  This is very important! Dehydration (the lack of fluid) can lead to a serious complication.  Start off with 1 tablespoon every 5 minutes for 8 hours.  You may begin eating bland foods after 8 hours without vomiting.  Start with saltine crackers, white bread, rice, mashed potatoes, applesauce.  After 48 hours on a bland diet, you may resume a normal diet.  Try to go to sleep.  Sleep often empties the stomach and relieves the need to vomit.  GET HELP RIGHT AWAY IF:   Your symptoms do not improve or worsen within 2 days after treatment.  You have a fever for over 3 days.  You cannot keep down fluids after trying the medication.  MAKE SURE YOU:   Understand these instructions.  Will watch your condition.  Will get help right away if you are not doing well or get worse.   Thank you for choosing an e-visit. Your e-visit answers were reviewed by a board certified advanced clinical practitioner to complete your personal care plan. Depending upon the condition, your plan could have included both over the counter or prescription medications. Please review your pharmacy choice. Be sure that the  pharmacy you have chosen is open so that you can pick up your prescription now.  If there is a problem you may message your provider in [place] to have the prescription routed to another pharmacy. Your safety is important to [place]. If you have drug allergies check your prescription carefully.  For the next 24 hours, you can use MyChart to ask questions about today's visit, request a non-urgent call back, or ask for a work or school excuse from your e-visit provider. You will get an e-mail in the next two days asking about your experience. I hope that your e-visit has been valuable and will speed your recovery.  5-10 minutes spent reviewing and documenting in chart.

## 2019-09-28 ENCOUNTER — Encounter: Payer: Self-pay | Admitting: Family Medicine

## 2019-09-28 ENCOUNTER — Other Ambulatory Visit (HOSPITAL_COMMUNITY)
Admission: RE | Admit: 2019-09-28 | Discharge: 2019-09-28 | Disposition: A | Discharge status: Home / Self Care | Payer: BC Managed Care – PPO | Source: Ambulatory Visit | Attending: Family Medicine | Admitting: Family Medicine

## 2019-09-28 ENCOUNTER — Ambulatory Visit (INDEPENDENT_AMBULATORY_CARE_PROVIDER_SITE_OTHER): Payer: BC Managed Care – PPO | Admitting: Family Medicine

## 2019-09-28 ENCOUNTER — Other Ambulatory Visit: Payer: Self-pay

## 2019-09-28 VITALS — BP 110/64 | HR 98 | Wt 132.2 lb

## 2019-09-28 DIAGNOSIS — Z30431 Encounter for routine checking of intrauterine contraceptive device: Secondary | ICD-10-CM | POA: Diagnosis not present

## 2019-09-28 DIAGNOSIS — Z113 Encounter for screening for infections with a predominantly sexual mode of transmission: Secondary | ICD-10-CM | POA: Insufficient documentation

## 2019-09-28 DIAGNOSIS — T753XXA Motion sickness, initial encounter: Secondary | ICD-10-CM | POA: Diagnosis not present

## 2019-09-28 MED ORDER — MECLIZINE HCL 50 MG PO TABS: 50.0000 mg | ORAL_TABLET | Freq: Three times a day (TID) | ORAL | 0 refills | Status: AC | PRN

## 2019-09-28 NOTE — Patient Instructions (Signed)
It was a pleasure to see you today! Thank you for choosing [facility] for your primary care. Katherine Neal was seen for STD check and IUD discussion. Come back to the clinic if there is anything that we can do for you before it is time to change out your Mirena in December..  Today we did a Pap smear with a cotest for HPV, trichomonas, gonorrhea and chlamydia.  We are also going to test for HIV as well.  I will call you personally if any of these test come back positive.    Please bring all your medications to every doctors visit   Sign up for My Chart to have easy access to your labs results, and communication with your Primary care physician.     Please check-out at the front desk before leaving the clinic.     Best,  Dr. Marthenia Rolling FAMILY MEDICINE RESIDENT - PGY3 09/28/2019 4:05 PM

## 2019-09-29 DIAGNOSIS — Z113 Encounter for screening for infections with a predominantly sexual mode of transmission: Secondary | ICD-10-CM | POA: Insufficient documentation

## 2019-09-29 DIAGNOSIS — T753XXA Motion sickness, initial encounter: Secondary | ICD-10-CM | POA: Insufficient documentation

## 2019-09-29 LAB — HIV ANTIBODY (ROUTINE TESTING W REFLEX): HIV Screen 4th Generation wRfx: NONREACTIVE

## 2019-09-29 NOTE — Assessment & Plan Note (Signed)
No symptoms currently, which is like to be sure.  HIV negative at time of writing this note, Pap with HPV, gonorrhea, chlamydia, trichomonas cotesting is pending.

## 2019-09-29 NOTE — Progress Notes (Signed)
    Subjective:  Katherine Neal is a 24 y.o. female who presents to the [facility] today with a chief complaint of STD testing and contraception management.   HPI: Patient had Mirena placed in 2016 and her calendar says it is time to get replaced, she has had no issues with it and no significant bleeding problems and would like to use the same method for birth control in the future.  She says she is safe at home and no one is making her do anything she does not want to do, would also like to get STD check.  As no specific suspicion but just wants to be sure, no new lesions or dysuria or discharge to complain of.  Also complains of motion sickness when riding in the passenger seat of her car.  She is not been driving lately and has been a passenger and this is a new thing for her, she says it is better if she just looks down but is had no balance issues when she is not in a vehicle.  No vomiting.  No new injuries.  Objective:  Physical Exam: BP 110/64   Pulse 98   Wt 132 lb 3.2 oz (60 kg)   SpO2 98%   BMI 26.70 kg/m   Gen: NAD, resting comfortably CV: RRR with no murmurs appreciated Pulm: NWOB, CTAB with no crackles, wheezes, or rhonchi GI: Normal bowel sounds present. Soft, Nontender, Nondistended. MSK: no edema, cyanosis, or clubbing noted *GU exam performed entirely with RN Dahlia Client in the room, no external lesions visualized, no pathologic discharge noted, cervix appears without pathology, no significant bleeding during Pap. Skin: warm, dry Neuro: grossly normal, moves all extremities Psych: Normal affect and thought content  Results for orders placed or performed in visit on 09/28/19 (from the past 72 hour(s))  HIV antibody (with reflex)     Status: None   Collection Time: 09/28/19  4:55 PM  Result Value Ref Range   HIV Screen 4th Generation wRfx Non Reactive Non Reactive     Assessment/Plan:  Screen for STD (sexually transmitted disease) No symptoms currently, which is like to be  sure.  HIV negative at time of writing this note, Pap with HPV, gonorrhea, chlamydia, trichomonas cotesting is pending.  Motion sickness Minor motion sickness when sitting in the passenger seat of the car, we did discuss it looking straight forward or looking down and avoiding the scenery passing her on the side is often helpful to some people.  We will also prescribe Antivert to see if this is helpful.  IUD check up Medication list seem to erroneously list Skyla levonorgestrel IUD when both patients memory and procedure documentation from that day indicate Mirena was actually placed.  Medication list is updated at this point and patient was informed that Mirena is now FDA approved for 6 years of pregnancy prevention.  Advised to place note in phone to schedule replacement in time as well as exchange date is placed in medication list here.   Marthenia Rolling, DO FAMILY MEDICINE RESIDENT - PGY3 09/29/2019 6:52 PM

## 2019-09-29 NOTE — Assessment & Plan Note (Signed)
Medication list seem to erroneously list Skyla levonorgestrel IUD when both patients memory and procedure documentation from that day indicate Mirena was actually placed.  Medication list is updated at this point and patient was informed that Mirena is now FDA approved for 6 years of pregnancy prevention.  Advised to place note in phone to schedule replacement in time as well as exchange date is placed in medication list here.

## 2019-09-29 NOTE — Assessment & Plan Note (Signed)
Minor motion sickness when sitting in the passenger seat of the car, we did discuss it looking straight forward or looking down and avoiding the scenery passing her on the side is often helpful to some people.  We will also prescribe Antivert to see if this is helpful.

## 2019-10-03 ENCOUNTER — Encounter: Payer: Self-pay | Admitting: Family Medicine

## 2019-10-03 LAB — CYTOLOGY - PAP
Adequacy: ABSENT
Chlamydia: NEGATIVE
Comment: NEGATIVE
Comment: NEGATIVE
Comment: NORMAL
Diagnosis: NEGATIVE
Neisseria Gonorrhea: NEGATIVE
Trichomonas: NEGATIVE

## 2019-11-13 ENCOUNTER — Telehealth: Payer: BC Managed Care – PPO | Admitting: Family

## 2019-11-13 DIAGNOSIS — R112 Nausea with vomiting, unspecified: Secondary | ICD-10-CM

## 2019-11-13 MED ORDER — ONDANSETRON HCL 4 MG PO TABS: 4.0000 mg | ORAL_TABLET | Freq: Three times a day (TID) | ORAL | 0 refills | Status: AC | PRN

## 2019-11-13 NOTE — Progress Notes (Signed)
We are sorry that you are not feeling well. Here is how we plan to help!  Based on what you have shared with me it looks like you have nausea.  Vomiting is the forceful emptying of a portion of the stomach's content through the mouth.  Although nausea and vomiting can make you feel miserable, it's important to remember that these are not diseases, but rather symptoms of an underlying illness.  When we treat short term symptoms, we always caution that any symptoms that persist should be fully evaluated in a medical office.  I have prescribed a medication that will help alleviate your symptoms and allow you to stay hydrated:  Zofran 4 mg 1 tablet every 8 hours as needed for nausea and vomiting  HOME CARE:  Drink clear liquids.  This is very important! Dehydration (the lack of fluid) can lead to a serious complication.  Start off with 1 tablespoon every 5 minutes for 8 hours.  You may begin eating bland foods after 8 hours without vomiting.  Start with saltine crackers, white bread, rice, mashed potatoes, applesauce.  After 48 hours on a bland diet, you may resume a normal diet.  Try to go to sleep.  Sleep often empties the stomach and relieves the need to vomit.  GET HELP RIGHT AWAY IF:   Your symptoms do not improve or worsen within 2 days after treatment.  You have a fever for over 3 days.  You cannot keep down fluids after trying the medication.  MAKE SURE YOU:   Understand these instructions.  Will watch your condition.  Will get help right away if you are not doing well or get worse.   Thank you for choosing an e-visit. Your e-visit answers were reviewed by a board certified advanced clinical practitioner to complete your personal care plan. Depending upon the condition, your plan could have included both over the counter or prescription medications. Please review your pharmacy choice. Be sure that the pharmacy you have chosen is open so that you can pick up your  prescription now.  If there is a problem you may message your provider in MyChart to have the prescription routed to another pharmacy. Your safety is important to [place]. If you have drug allergies check your prescription carefully.  For the next 24 hours, you can use MyChart to ask questions about today's visit, request a non-urgent call back, or ask for a work or school excuse from your e-visit provider. You will get an e-mail in the next two days asking about your experience. I hope that your e-visit has been valuable and will speed your recovery.  Approximately 5 minutes was spent documenting and reviewing patient's chart.

## 2020-02-02 ENCOUNTER — Inpatient Hospital Stay
Admission: RE | Admit: 2020-02-02 | Discharge: 2020-02-02 | Disposition: A | Discharge status: Home / Self Care | Payer: BC Managed Care – PPO | Source: Ambulatory Visit

## 2020-02-02 ENCOUNTER — Ambulatory Visit (INDEPENDENT_AMBULATORY_CARE_PROVIDER_SITE_OTHER)
Admission: RE | Admit: 2020-02-02 | Discharge: 2020-02-02 | Disposition: A | Discharge status: Other Acute Inpatient Hospital | Payer: BC Managed Care – PPO | Source: Ambulatory Visit

## 2020-02-02 DIAGNOSIS — R1010 Upper abdominal pain, unspecified: Secondary | ICD-10-CM

## 2020-02-02 NOTE — Discharge Instructions (Signed)
Come to the Urgent Care for in-person evaluation of your abdominal pain.

## 2020-02-02 NOTE — ED Provider Notes (Signed)
Virtual Visit via Video Note:  Katherine Neal  initiated request for Telemedicine visit with [facility] team. I connected with Katherine Neal  on 02/02/2020 at 2:10 PM  for a synchronized telemedicine visit using a video enabled HIPPA compliant telemedicine application. I verified that I am speaking with Katherine Neal  using two identifiers. Mickie Bail, NP  was physically located in a [facility] Urgent care site and Katherine Neal was located at a different location.   The limitations of evaluation and management by telemedicine as well as the availability of in-person appointments were discussed. Patient was informed that she  may incur a bill ( including co-pay) for this virtual visit encounter. Katherine Neal  expressed understanding and gave verbal consent to proceed with virtual visit.     History of Present Illness:Katherine Neal  is a 24 y.o. female presents for evaluation of upper abdominal pain radiating to her back since last evening.  She describes the pain as "felt like on fire" in bilateral upper abdomen, currently 8/10.  She denies fever, chills, dysuria, vomiting, diarrhea, vaginal discharge, pelvic pain, or other symptoms.  Treatment attempted with ibuprofen.  She denies current pregnancy or breastfeeding.      No Known Allergies   History reviewed. No pertinent past medical history.   Social History   Tobacco Use  . Smoking status: Never Smoker  . Smokeless tobacco: Never Used  Substance Use Topics  . Alcohol use: No  . Drug use: No    ROS: as stated in HPI.  All other systems reviewed and negative.      Observations/Objective: Physical Exam  VITALS: Patient denies fever. GENERAL: Alert, appears well and in no acute distress. HEENT: Atraumatic. NECK: Normal movements of the head and neck. CARDIOPULMONARY: No increased WOB. Speaking in clear sentences. I:E ratio WNL.  MS: Moves all visible extremities without noticeable  abnormality. PSYCH: Pleasant and cooperative, well-groomed. Speech normal rate and rhythm. Affect is appropriate. Insight and judgement are appropriate. Attention is focused, linear, and appropriate.  NEURO: CN grossly intact. Oriented as arrived to appointment on time with no prompting. Moves both UE equally.  SKIN: No obvious lesions, wounds, erythema, or cyanosis noted on face or hands.   Assessment and Plan:    ICD-10-CM   1. Pain of upper abdomen  R10.10        Follow Up Instructions: Discussed with patient that I cannot adequately evaluate her 8 out of 10 abdominal pain on a video visit.  She agrees to come to the urgent care for in person evaluation.      I discussed the assessment and treatment plan with the patient. The patient was provided an opportunity to ask questions and all were answered. The patient agreed with the plan and demonstrated an understanding of the instructions.   The patient was advised to call back or seek an in-person evaluation if the symptoms worsen or if the condition fails to improve as anticipated.      Mickie Bail, NP  02/02/2020 2:10 PM         Mickie Bail, NP 02/02/20 1410

## 2020-02-03 ENCOUNTER — Ambulatory Visit: Payer: BC Managed Care – PPO

## 2020-02-06 ENCOUNTER — Telehealth: Payer: BC Managed Care – PPO | Admitting: Emergency Medicine

## 2020-02-06 ENCOUNTER — Encounter: Payer: Self-pay | Admitting: Family Medicine

## 2020-02-06 ENCOUNTER — Ambulatory Visit: Payer: BC Managed Care – PPO

## 2020-02-06 DIAGNOSIS — M549 Dorsalgia, unspecified: Secondary | ICD-10-CM

## 2020-02-06 DIAGNOSIS — R110 Nausea: Secondary | ICD-10-CM

## 2020-02-06 DIAGNOSIS — R109 Unspecified abdominal pain: Secondary | ICD-10-CM

## 2020-02-06 NOTE — Progress Notes (Signed)
Based on what you shared with me, I feel your condition warrants further evaluation and I recommend that you be seen for a face to face office visit.  I think you really need to be seen in person.  With no history of injury or prior back pain, you need further evaluation in person.  Back pain that radiates to the abdomen and is associated with nausea has a number of serious potential etiologies including: gallbladder disease, pancreatitis, pyelonephritis (kidney infection) and kidney stones.   NOTE: If you entered your credit card information for this eVisit, you will not be charged. You may see a "hold" on your card for the $35 but that hold will drop off and you will not have a charge processed.   If you are having a true medical emergency please call 911.      For an urgent face to face visit, [facility] has five urgent care centers for your convenience:      NEW:  [facility] at [facility] Directions [phone] [address] [place], [place] [address] . 10 am - 6pm Monday - Friday    [facility]) Get Driving Directions [phone] [address] [place], [place] [address] . 10 am to 8 pm Monday-Friday . 12 pm to 8 pm [facility] at [facility] Get Driving Directions [phone] [address] [place], [place] [address] . 8 am to 8 pm Monday-Friday . 9 am to 6 pm Saturday . 11 am to 6 pm Sunday     [facility] at [facility] Get Driving Directions  [phone] [address] [place], [place] [address] . 8 am to 8 pm Monday-Friday . 8 am to 4 pm [facility] Urgent Care at [facility] Directions [phone] [address] [place], [place] [address] . 12 pm to 6 pm Monday-Friday      Your e-visit answers were reviewed by a board certified advanced clinical practitioner  to complete your personal care plan.  Thank you for using e-Visits.   Approximately 5 minutes was used in reviewing the patient's chart, questionnaire, prescribing medications, and documentation.

## 2020-02-06 NOTE — Progress Notes (Deleted)
    SUBJECTIVE:   CHIEF COMPLAINT / HPI: Abdominal pain  Ula is a 24 year old female presenting discussed the following:    ***  PERTINENT  PMH / PSH: ***  OBJECTIVE:   There were no vitals taken for this visit.  ***  ASSESSMENT/PLAN:   No problem-specific Assessment & Plan notes found for this encounter.     Allayne Stack, DO [facility]

## 2020-08-02 ENCOUNTER — Ambulatory Visit (INDEPENDENT_AMBULATORY_CARE_PROVIDER_SITE_OTHER): Payer: BC Managed Care – PPO | Admitting: Family Medicine

## 2020-08-02 ENCOUNTER — Telehealth: Payer: Self-pay | Admitting: Family Medicine

## 2020-08-02 ENCOUNTER — Other Ambulatory Visit: Payer: Self-pay

## 2020-08-02 ENCOUNTER — Other Ambulatory Visit (HOSPITAL_COMMUNITY)
Admission: RE | Admit: 2020-08-02 | Discharge: 2020-08-02 | Disposition: A | Discharge status: Home / Self Care | Payer: BC Managed Care – PPO | Source: Ambulatory Visit | Attending: Family Medicine | Admitting: Family Medicine

## 2020-08-02 VITALS — BP 104/64 | HR 102 | Ht 59.0 in | Wt 132.2 lb

## 2020-08-02 DIAGNOSIS — N898 Other specified noninflammatory disorders of vagina: Secondary | ICD-10-CM | POA: Insufficient documentation

## 2020-08-02 DIAGNOSIS — Z30433 Encounter for removal and reinsertion of intrauterine contraceptive device: Secondary | ICD-10-CM

## 2020-08-02 DIAGNOSIS — T8332XA Displacement of intrauterine contraceptive device, initial encounter: Secondary | ICD-10-CM | POA: Diagnosis not present

## 2020-08-02 LAB — POCT WET PREP (WET MOUNT)
Clue Cells Wet Prep Whiff POC: POSITIVE
Trichomonas Wet Prep HPF POC: ABSENT

## 2020-08-02 MED ORDER — MUPIROCIN 2 % EX OINT: 1.0000 "application " | TOPICAL_OINTMENT | Freq: Two times a day (BID) | CUTANEOUS | 0 refills | Status: AC

## 2020-08-02 MED ORDER — METRONIDAZOLE 0.75 % VA GEL
1.0000 | Freq: Every day | VAGINAL | 0 refills | Status: AC
Start: 2020-08-02 — End: 2020-08-07

## 2020-08-02 NOTE — Telephone Encounter (Signed)
Result discussed with her.  + BV  Metronidazole tab recommended. Patient stated that she is unable to tolerate the pills.  I escribed MetroGel instead. She is aware, it might be a bit more expensive.

## 2020-08-02 NOTE — Addendum Note (Signed)
Addended by: Janit Pagan T on: 08/02/2020 12:13 PM   Modules accepted: Level of Service

## 2020-08-02 NOTE — Progress Notes (Signed)
IUD Insertion/Removal Procedure Note  Pre-operative Diagnosis: Need for contraception  Post-operative Diagnosis: same, vaginal discharge, ingrown hair  Indications: contraception  Procedure Details  Urine pregnancy test was not done.  The risks (including infection, bleeding, pain, and uterine perforation) and benefits of the procedure were explained to the patient and Written informed consent was obtained.    On exam, ingrown hair noted to left superior labia majora. Upon visualization of the cervix, whitish discharge was noted. Wet prep and GC/chlamydia were collected and sent to the lab. IUD strings were not visible. Blind IUD removal was attempted, but unsuccessful. IUD was visualized on bedside ultrasound. Patient tolerated procedure well.  IUD Information: Not inserted.  Condition: Stable  Complications: None  Plan: Unsuccessful IUD removal - referral placed for gynecology Ingrown hair on labia - mupirocin ointment BID x 5d  The patient was advised to call for any fever or for prolonged or severe pain or bleeding. She was advised to use OTC acetaminophen and OTC analgesics as needed for mild to moderate pain.   Littie Deeds, MD

## 2020-08-02 NOTE — Patient Instructions (Signed)

## 2020-08-03 ENCOUNTER — Other Ambulatory Visit: Payer: Self-pay | Admitting: Anesthesiology

## 2020-08-03 DIAGNOSIS — T8332XA Displacement of intrauterine contraceptive device, initial encounter: Secondary | ICD-10-CM

## 2020-08-03 LAB — CERVICOVAGINAL ANCILLARY ONLY
Chlamydia: NEGATIVE
Comment: NEGATIVE
Comment: NORMAL
Neisseria Gonorrhea: NEGATIVE

## 2020-08-21 ENCOUNTER — Ambulatory Visit (INDEPENDENT_AMBULATORY_CARE_PROVIDER_SITE_OTHER): Payer: BC Managed Care – PPO

## 2020-08-21 ENCOUNTER — Other Ambulatory Visit: Payer: Self-pay

## 2020-08-21 ENCOUNTER — Ambulatory Visit: Payer: BC Managed Care – PPO | Admitting: Obstetrics & Gynecology

## 2020-08-21 DIAGNOSIS — T8332XD Displacement of intrauterine contraceptive device, subsequent encounter: Secondary | ICD-10-CM

## 2020-08-21 DIAGNOSIS — N854 Malposition of uterus: Secondary | ICD-10-CM | POA: Diagnosis not present

## 2020-08-21 DIAGNOSIS — Z30433 Encounter for removal and reinsertion of intrauterine contraceptive device: Secondary | ICD-10-CM

## 2020-08-21 DIAGNOSIS — T8332XA Displacement of intrauterine contraceptive device, initial encounter: Secondary | ICD-10-CM

## 2020-08-21 NOTE — Progress Notes (Signed)
Katherine Neal Jul 06, 1996 [id number]        24 y.o.  G0  RP: Mirena IUD strings lost for IUD removal and insertion  HPI: Mirena IUD x about 2016-2017.  Very well on it with no BTB or pain.  Last visit at her Fam MD, IUD strings were lost and IUD couldn't be removed.     OB History  Gravida Para Term Preterm AB Living  0 0 0 0 0 0  SAB IAB Ectopic Multiple Live Births  0 0 0 0      Past medical history,surgical history, problem list, medications, allergies, family history and social history were all reviewed and documented in the EPIC chart.   Directed ROS with pertinent positives and negatives documented in the history of present illness/assessment and plan.  Exam:  There were no vitals filed for this visit. General appearance:  Normal                                                                    IUD procedure note       Patient presented to the office today for removal and placement of Mirena IUD. The patient had previously been provided with literature information on this method of contraception. The risks benefits and pros and cons were discussed and all her questions were answered. She is fully aware that this form of contraception is 99% effective and is good for 5 years.  Pelvic exam: Vulva normal Vagina: No lesions or discharge Cervix: No lesions or discharge.  Betadine Prep.  Hurricane spray.  Strings not visible at the EO.  Tenaculum applied on the anterior lip of the cervix.  Os finder used.  Under US guidance, IUD strings grasped in the IU cavity with the IUD clamp.  IUD removed by pulling on strings.  IUD complete and intact.  Well tolerated by patient. Uterus: RV position Adnexa: No masses or tenderness Rectal exam: Not done  The cervix was already cleansed with Betadine solution, Hurricane sprayd on the cervix and a single-tooth tenaculum was placed on the anterior cervical lip. The IUD was shown to the patient and inserted in a sterile fashion under  [place].  The IUD string was trimmed. The single-tooth tenaculum was removed. The IUD was shown in the intrauterine cavity by Transvaginal US after the speculum was removed.      Pelvic ultrasound today: T/V images.  Retroverted uterus normal in size and shape with no myometrial mass measured at 7.08 x 3.78 x 3.29 cm.  The endometrial lining is thin and symmetrical measured at 3.06 mm with the intra uterine device in correct intra uterine position.  No intra uterine mass or thickening seen.  Both ovaries are normal in size with normal follicular pattern.  No adnexal mass.  No free fluid in the posterior cul-de-sac.  IUD removal and reinsertion with ultrasound guidance.  Post insertion IUD placement confirmed in good intra uterine position at 2.05 cm of the fundus and 1.41 cm of the cervical os.  Appearance of a possible clot versus other etiology seen in the mid cavity measured at 7 x 9 mm, avascular with Doppler flow surrounding.   Assessment/Plan:  24 y.o. G0  1. Intrauterine contraceptive device threads lost, subsequent encounter Successful  removal of IUD under ultrasound guidance.  2. Encounter for IUD removal and reinsertion Successful removal of IUD under ultrasound guidance.  IUD complete and intact.  Successful insertion of Mirena IUD under ultrasound guidance. Well-tolerated by patient with no complication.  Postprocedure precaution reviewed.  We will follow-up in 4 weeks with ultrasound confirmation of the IUD placement and reassessment of the mid cavity lesion measured at 7 x 9 mm today. - US Transvaginal Non-OB; Future  Genia Del MD, 9:26 AM 08/21/2020

## 2020-08-23 ENCOUNTER — Encounter: Payer: Self-pay | Admitting: Anesthesiology

## 2020-08-23 ENCOUNTER — Encounter: Payer: Self-pay | Admitting: Obstetrics & Gynecology

## 2020-09-20 ENCOUNTER — Other Ambulatory Visit: Payer: BC Managed Care – PPO

## 2020-09-20 ENCOUNTER — Ambulatory Visit: Payer: BC Managed Care – PPO | Admitting: Obstetrics & Gynecology

## 2020-09-22 ENCOUNTER — Encounter: Payer: Self-pay | Admitting: Family Medicine

## 2020-10-11 ENCOUNTER — Other Ambulatory Visit: Payer: Self-pay

## 2020-10-11 DIAGNOSIS — Z30433 Encounter for removal and reinsertion of intrauterine contraceptive device: Secondary | ICD-10-CM

## 2020-10-18 ENCOUNTER — Other Ambulatory Visit: Payer: BC Managed Care – PPO | Admitting: Obstetrics & Gynecology

## 2020-10-18 ENCOUNTER — Other Ambulatory Visit: Payer: BC Managed Care – PPO

## 2021-04-07 ENCOUNTER — Telehealth: Payer: BC Managed Care – PPO | Admitting: Physician Assistant

## 2021-04-07 DIAGNOSIS — R300 Dysuria: Secondary | ICD-10-CM

## 2021-04-07 MED ORDER — CEPHALEXIN 500 MG PO CAPS: 500.0000 mg | ORAL_CAPSULE | Freq: Two times a day (BID) | ORAL | 0 refills | Status: AC

## 2021-04-07 NOTE — Progress Notes (Signed)
I have spent 5 minutes in review of e-visit questionnaire, review and updating patient chart, medical decision making and response to patient.   Deniece Rankin Cody Flora Ratz, PA-C    

## 2021-04-07 NOTE — Progress Notes (Signed)

## 2021-04-07 NOTE — Progress Notes (Signed)
Duplicate encounter.  Disregard. 

## 2022-02-04 ENCOUNTER — Encounter: Payer: Self-pay | Admitting: *Deleted

## 2022-11-14 ENCOUNTER — Ambulatory Visit: Payer: Commercial Managed Care - PPO | Admitting: Family Medicine

## 2024-02-09 ENCOUNTER — Ambulatory Visit (INDEPENDENT_AMBULATORY_CARE_PROVIDER_SITE_OTHER): Payer: Medicaid Other | Admitting: Radiology

## 2024-02-09 ENCOUNTER — Encounter: Payer: Self-pay | Admitting: Radiology

## 2024-02-09 ENCOUNTER — Other Ambulatory Visit (HOSPITAL_COMMUNITY)
Admission: RE | Admit: 2024-02-09 | Discharge: 2024-02-09 | Disposition: A | Discharge status: Home / Self Care | Source: Ambulatory Visit | Attending: Radiology | Admitting: Radiology

## 2024-02-09 VITALS — BP 116/72 | HR 96 | Ht 60.5 in | Wt 127.0 lb

## 2024-02-09 DIAGNOSIS — Z1331 Encounter for screening for depression: Secondary | ICD-10-CM

## 2024-02-09 DIAGNOSIS — T8332XA Displacement of intrauterine contraceptive device, initial encounter: Secondary | ICD-10-CM

## 2024-02-09 DIAGNOSIS — Z01419 Encounter for gynecological examination (general) (routine) without abnormal findings: Secondary | ICD-10-CM | POA: Insufficient documentation

## 2024-02-09 NOTE — Progress Notes (Signed)
 Katherine Neal 1996/06/08 [id number]   History:  28 y.o. G0 presents for annual exam. Last OV 2021. Mirena  inserted and confirmed placement under u/s. Considering IUD removal 'I just want to be done with birth control'. On u/s in 2021 there was a 7x70mm clot vs other mass seen in the uterus. No follow up since. Amenorrheic with IUD. Not planning a pregnancy in the near future. Would use condoms for pregnancy prevention after IUD removal, does not want another hormonal method. Declines need for STI screening.   Gynecologic History No LMP recorded. (Menstrual status: IUD).   Contraception/Family planning: IUD Sexually active: yes Last Pap: 2021. Results were: normal   Obstetric History OB History  Gravida Para Term Preterm AB Living  0 0 0 0 0 0  SAB IAB Ectopic Multiple Live Births  0 0 0 0        02/09/2024    9:36 AM 09/28/2019    3:35 PM 11/03/2018    3:44 PM  Depression screen PHQ 2/9  Decreased Interest 0 0 1  Down, Depressed, Hopeless 0 0 2  PHQ - 2 Score 0 0 3  Altered sleeping   3  Tired, decreased energy   3  Change in appetite   2  Feeling bad or failure about yourself    1  Trouble concentrating   0  Moving slowly or fidgety/restless   1  Suicidal thoughts   1  PHQ-9 Score   14  Difficult doing work/chores   Somewhat difficult     The following portions of the patient's history were reviewed and updated as appropriate: allergies, current medications, past family history, past medical history, past social history, past surgical history, and problem list.  Review of Systems  All other systems reviewed and are negative.  U/S 12/21 Narrative & Impression  T/V images.  Retroverted uterus normal in size and shape with no myometrial mass measured at 7.08 x 3.78 x 3.29 cm.  The endometrial lining is thin and symmetrical measured at 3.06 mm with the intra uterine device in correct intra uterine position.  No intra uterine mass or thickening seen.  Both ovaries are  normal in size with normal follicular pattern.  No adnexal mass.  No free fluid in the posterior cul-de-sac.  IUD removal and reinsertion with ultrasound guidance.  Post insertion IUD placement confirmed in good intra uterine position at 2.05 cm of the fundus and 1.41 cm of the cervical os.  Appearance of a possible clot versus other etiology seen in the mid cavity measured at 7 x 9 mm, avascular with Doppler flow surrounding.    Past medical history, past surgical history, family history and social history were all reviewed and documented in the EPIC chart.  Exam:  Vitals:   02/09/24 0932  BP: 116/72  Pulse: 96  SpO2: 99%  Weight: 127 lb (57.6 kg)  Height: 5' 0.5" (1.537 m)   Body mass index is 24.39 kg/m.  Physical Exam Vitals and nursing note reviewed. Exam conducted with a chaperone present.  Constitutional:      Appearance: Normal appearance. She is normal weight.  HENT:     Head: Normocephalic and atraumatic.  Cardiovascular:     Rate and Rhythm: Regular rhythm.  Pulmonary:     Effort: Pulmonary effort is normal.  Chest:  Breasts:    Breasts are symmetrical.     Right: Normal. No inverted nipple, mass, nipple discharge, skin change or tenderness.     Left: Normal.  No inverted nipple, mass, nipple discharge, skin change or tenderness.  Abdominal:     General: Abdomen is flat. Bowel sounds are normal.     Palpations: Abdomen is soft.  Genitourinary:    General: Normal vulva.     Vagina: Normal. No vaginal discharge, bleeding or lesions.     Cervix: Normal. No discharge or lesion.     Uterus: Normal. Not enlarged and not tender.      Adnexa: Right adnexa normal and left adnexa normal.       Right: No mass, tenderness or fullness.         Left: No mass, tenderness or fullness.       Comments: IUD strings not seen despite cytobrush probing Lymphadenopathy:     Upper Body:     Right upper body: No axillary adenopathy.     Left upper body: No axillary adenopathy.   Skin:    General: Skin is warm and dry.  Neurological:     Mental Status: She is alert and oriented to person, place, and time.  Psychiatric:        Mood and Affect: Mood normal.        Thought Content: Thought content normal.        Judgment: Judgment normal.      Ellis Guys, CMA present for exam  Assessment/Plan:   1. Well woman exam with routine gynecological exam (Primary) - Cytology - PAP( [place])  2. Depression screening negative  3. Intrauterine contraceptive device threads lost, initial encounter Will check for strings before removal and assess presence of previous clot vs fibroid/polyp/mass before IUD removal. Will contact with results, plan and necessary follow up.  - US  Transvaginal Non-OB; Future    Return for u/s only.  Synetta Eves B WHNP-BC 10:07 AM 02/09/2024

## 2024-02-09 NOTE — Patient Instructions (Signed)

## 2024-02-10 ENCOUNTER — Ambulatory Visit (INDEPENDENT_AMBULATORY_CARE_PROVIDER_SITE_OTHER)

## 2024-02-10 DIAGNOSIS — T8332XA Displacement of intrauterine contraceptive device, initial encounter: Secondary | ICD-10-CM

## 2024-02-10 LAB — CYTOLOGY - PAP: Diagnosis: NEGATIVE

## 2024-02-11 ENCOUNTER — Ambulatory Visit: Payer: Self-pay | Admitting: Radiology

## 2024-02-11 DIAGNOSIS — N83209 Unspecified ovarian cyst, unspecified side: Secondary | ICD-10-CM

## 2024-02-11 MED ORDER — JUNEL FE 24 1-20 MG-MCG(24) PO TABS: 1.0000 | ORAL_TABLET | Freq: Every day | ORAL | 0 refills | Status: AC

## 2024-02-17 ENCOUNTER — Other Ambulatory Visit: Payer: Self-pay | Admitting: Radiology

## 2024-02-17 DIAGNOSIS — N83209 Unspecified ovarian cyst, unspecified side: Secondary | ICD-10-CM

## 2024-02-18 ENCOUNTER — Telehealth: Payer: Self-pay | Admitting: *Deleted

## 2024-02-18 NOTE — Telephone Encounter (Signed)
 Left message to call [facility] Triage at [phone], option 4.

## 2024-02-18 NOTE — Telephone Encounter (Signed)
-----   Message from [employer] sent at 02/17/2024  3:41 PM EDT ----- Regarding: ? Patient has question on the RX Jami prescribed.

## 2024-02-23 NOTE — Telephone Encounter (Signed)
No response from patient. Encounter closed.

## 2024-03-24 ENCOUNTER — Other Ambulatory Visit: Payer: Self-pay | Admitting: Radiology

## 2024-03-24 DIAGNOSIS — N83209 Unspecified ovarian cyst, unspecified side: Secondary | ICD-10-CM

## 2024-03-24 DIAGNOSIS — N83201 Unspecified ovarian cyst, right side: Secondary | ICD-10-CM

## 2024-05-03 ENCOUNTER — Other Ambulatory Visit: Admitting: Radiology

## 2024-05-03 ENCOUNTER — Other Ambulatory Visit

## 2024-05-23 ENCOUNTER — Encounter: Payer: Self-pay | Admitting: Radiology

## 2024-06-13 ENCOUNTER — Telehealth: Payer: Self-pay | Admitting: Radiology

## 2024-06-13 NOTE — Telephone Encounter (Signed)
 Patient no show to her ultrasound appointment and has no rescheduled. Two messages were left and letter mailed asking patient to call and reschedule.

## 2024-06-20 NOTE — Telephone Encounter (Signed)
 PUS ordered 02/17/24 for ovarian cyst  Call placed to patient, left detailed message, ok per dpr. Advised f/u on PUS, return call to office at [phone], opt 1 to schedule; opt 4 to further discuss any barriers to scheduling.

## 2024-07-01 NOTE — Telephone Encounter (Signed)
 No response from patient.  No future appts scheduled.   Please advise.

## 2024-07-04 NOTE — Telephone Encounter (Signed)
 Please send letter.

## 2024-07-13 NOTE — Telephone Encounter (Signed)
 Letter pended and printed. Copy to Jami to review to be signed and mailed.  Order discontinued.

## 2024-09-13 ENCOUNTER — Telehealth: Payer: Self-pay | Admitting: *Deleted

## 2024-09-13 DIAGNOSIS — Z30432 Encounter for removal of intrauterine contraceptive device: Secondary | ICD-10-CM

## 2024-09-13 NOTE — Telephone Encounter (Signed)
 Encounter closed. Appt desk updated.

## 2024-09-13 NOTE — Telephone Encounter (Signed)
 Yes, that's fine!

## 2024-09-13 NOTE — Telephone Encounter (Signed)
 Spoke with patient. Patient is scheduled for PUS on 09/20/24 for f/u of ovarian cyst. Patient has requested IUD removal during this visit. Patient states she would like to try to conceive in the near future, is no longer taking Junel  Fe . Advised OK to keep appt as scheduled, will provide update to [place], our office will return call if any additional recommendations. Patient agreeable.   Last AEX 02/09/24  Order pended for IUD removal.   Jami -ok to proceed as scheduled?

## 2024-09-14 ENCOUNTER — Other Ambulatory Visit: Payer: Self-pay | Admitting: Radiology

## 2024-09-14 DIAGNOSIS — N83201 Unspecified ovarian cyst, right side: Secondary | ICD-10-CM

## 2024-09-20 ENCOUNTER — Ambulatory Visit (INDEPENDENT_AMBULATORY_CARE_PROVIDER_SITE_OTHER)

## 2024-09-20 ENCOUNTER — Encounter: Payer: Self-pay | Admitting: Radiology

## 2024-09-20 ENCOUNTER — Ambulatory Visit (INDEPENDENT_AMBULATORY_CARE_PROVIDER_SITE_OTHER): Admitting: Radiology

## 2024-09-20 DIAGNOSIS — Z30432 Encounter for removal of intrauterine contraceptive device: Secondary | ICD-10-CM

## 2024-09-20 DIAGNOSIS — N83201 Unspecified ovarian cyst, right side: Secondary | ICD-10-CM | POA: Diagnosis not present

## 2024-09-20 NOTE — Progress Notes (Signed)
" ° °  Katherine Neal 1996-01-01 [id number]   History:  29 y.o. G0 presents for removal of Mirena  IUD. And u/s to follow up re: ovarian cyst. Reason for discontinuation: desires pregnancy Pt has been counseled about risks and benefits as well as complications.   Time out performed and Informed consent is obtained today.  No LMP recorded. (Menstrual status: IUD).   Past medical history, past surgical history, family history and social history were all reviewed and documented in the EPIC chart.  ROS:  A ROS was performed and pertinent positives and negatives are included.  Exam: There were no vitals filed for this visit. There is no height or weight on file to calculate BMI.  Pelvic exam: Vulva:  normal female genitalia Vagina:  normal vagina, no discharge, exudate, lesion, or erythema Cervix:  Non-tender, Negative CMT, no lesions or redness. IUD strings visualized. Uterus:  normal shape, position and consistency    Procedure:  Speculum inserted.   Cervix visualized, IUD grasps with forceps and removed without difficulty. Pt tolerated procedure well.   Chaperone, Darice Hoit, CMA present for exam   Assessment/Plan: 1. Right ovarian cyst (Primary) Previous cyst resolved. A new complex 26x104mm cyst noted on the right. Warning signs reviewed.  2. Encounter for IUD removal - Begin PNV - IUD removal   Pt aware to call for any concerns, return to office as schedule for annual exam or as needed.   Manuela Halbur B WHNP-BC, 2:37 PM 09/20/2024  "
